# Patient Record
Sex: Female | Born: 1975 | Race: Black or African American | Hispanic: No | Marital: Single | Smoking: Never smoker
Health system: Southern US, Community
[De-identification: ages and names within clinical notes are randomized; demographics above are authoritative.]

## PROBLEM LIST (undated history)

## (undated) ENCOUNTER — Emergency Department (HOSPITAL_COMMUNITY): Admission: EM | Discharge status: Absent without leave | Payer: Self-pay

## (undated) DIAGNOSIS — O139 Gestational [pregnancy-induced] hypertension without significant proteinuria, unspecified trimester: Secondary | ICD-10-CM

## (undated) DIAGNOSIS — O1490 Unspecified pre-eclampsia, unspecified trimester: Secondary | ICD-10-CM

## (undated) DIAGNOSIS — Z789 Other specified health status: Secondary | ICD-10-CM

## (undated) HISTORY — DX: Unspecified pre-eclampsia, unspecified trimester: O14.90

## (undated) HISTORY — DX: Gestational (pregnancy-induced) hypertension without significant proteinuria, unspecified trimester: O13.9

## (undated) HISTORY — PX: ACHILLES TENDON REPAIR: SUR1153

---

## 2009-11-15 ENCOUNTER — Emergency Department (HOSPITAL_COMMUNITY): Admission: EM | Admit: 2009-11-15 | Discharge: 2009-11-15 | Payer: Self-pay | Admitting: Family Medicine

## 2009-12-07 ENCOUNTER — Emergency Department (HOSPITAL_COMMUNITY): Admission: EM | Admit: 2009-12-07 | Discharge: 2009-12-07 | Payer: Self-pay | Admitting: Emergency Medicine

## 2011-12-23 LAB — OB RESULTS CONSOLE HEPATITIS B SURFACE ANTIGEN: Hepatitis B Surface Ag: NEGATIVE

## 2011-12-23 LAB — OB RESULTS CONSOLE GC/CHLAMYDIA
Chlamydia: NEGATIVE
Gonorrhea: NEGATIVE

## 2011-12-23 LAB — OB RESULTS CONSOLE RUBELLA ANTIBODY, IGM: Rubella: IMMUNE

## 2011-12-23 LAB — OB RESULTS CONSOLE RPR: RPR: NONREACTIVE

## 2011-12-23 LAB — OB RESULTS CONSOLE HIV ANTIBODY (ROUTINE TESTING): HIV: NONREACTIVE

## 2012-07-08 ENCOUNTER — Encounter (HOSPITAL_COMMUNITY): Payer: Self-pay | Admitting: *Deleted

## 2012-07-08 ENCOUNTER — Other Ambulatory Visit: Payer: Self-pay | Admitting: Obstetrics and Gynecology

## 2012-07-08 ENCOUNTER — Encounter (HOSPITAL_COMMUNITY): Payer: Self-pay | Admitting: Pharmacist

## 2012-07-09 ENCOUNTER — Encounter (HOSPITAL_COMMUNITY)
Admission: RE | Disposition: A | Discharge status: Home / Self Care | Payer: Self-pay | Source: Ambulatory Visit | Attending: Obstetrics and Gynecology

## 2012-07-09 ENCOUNTER — Inpatient Hospital Stay (HOSPITAL_COMMUNITY)
Admission: RE | Admit: 2012-07-09 | Discharge: 2012-07-11 | DRG: 371 | Disposition: A | Discharge status: Home / Self Care | Payer: BC Managed Care – PPO | Source: Ambulatory Visit | Attending: Obstetrics and Gynecology | Admitting: Obstetrics and Gynecology

## 2012-07-09 ENCOUNTER — Encounter (HOSPITAL_COMMUNITY): Payer: Self-pay | Admitting: *Deleted

## 2012-07-09 ENCOUNTER — Encounter (HOSPITAL_COMMUNITY): Payer: Self-pay | Admitting: Anesthesiology

## 2012-07-09 ENCOUNTER — Inpatient Hospital Stay (HOSPITAL_COMMUNITY): Payer: BC Managed Care – PPO | Admitting: Anesthesiology

## 2012-07-09 DIAGNOSIS — O480 Post-term pregnancy: Principal | ICD-10-CM | POA: Diagnosis present

## 2012-07-09 DIAGNOSIS — O34219 Maternal care for unspecified type scar from previous cesarean delivery: Secondary | ICD-10-CM | POA: Diagnosis present

## 2012-07-09 DIAGNOSIS — O09529 Supervision of elderly multigravida, unspecified trimester: Secondary | ICD-10-CM | POA: Diagnosis present

## 2012-07-09 HISTORY — DX: Other specified health status: Z78.9

## 2012-07-09 LAB — COMPREHENSIVE METABOLIC PANEL
ALT: 12 U/L (ref 0–35)
Albumin: 2.5 g/dL — ABNORMAL LOW (ref 3.5–5.2)
Alkaline Phosphatase: 110 U/L (ref 39–117)
BUN: 7 mg/dL (ref 6–23)
Chloride: 100 mEq/L (ref 96–112)
GFR calc Af Amer: >90 mL/min (ref >90)
Glucose, Bld: 92 mg/dL (ref 70–99)
Potassium: 4.1 mEq/L (ref 3.5–5.1)
Sodium: 133 mEq/L — ABNORMAL LOW (ref 135–145)
Total Bilirubin: 0.5 mg/dL (ref 0.3–1.2)
Total Protein: 6.4 g/dL (ref 6.0–8.3)

## 2012-07-09 LAB — RPR: RPR Ser Ql: NONREACTIVE

## 2012-07-09 LAB — CBC
HCT: 31.3 % — ABNORMAL LOW (ref 36.0–46.0)
Hemoglobin: 10.7 g/dL — ABNORMAL LOW (ref 12.0–15.0)
Hemoglobin: 10 g/dL — ABNORMAL LOW (ref 12.0–15.0)
MCH: 29.2 pg (ref 26.0–34.0)
MCHC: 32.6 g/dL (ref 30.0–36.0)
WBC: 5.7 10*3/uL (ref 4.0–10.5)

## 2012-07-09 LAB — URIC ACID: Uric Acid, Serum: 4.1 mg/dL (ref 2.4–7.0)

## 2012-07-09 LAB — PREPARE RBC (CROSSMATCH)

## 2012-07-09 SURGERY — Surgical Case
Anesthesia: Spinal | Site: Abdomen

## 2012-07-09 MED ORDER — BUPIVACAINE IN DEXTROSE 0.75-8.25 % IT SOLN
INTRATHECAL | Status: DC | PRN
  Administered 2012-07-09: 1.6 mL via INTRATHECAL

## 2012-07-09 MED ORDER — PHENYLEPHRINE HCL 10 MG/ML IJ SOLN
INTRAMUSCULAR | Status: DC | PRN
  Administered 2012-07-09 (×2): 40 ug via INTRAVENOUS
  Administered 2012-07-09: 80 ug via INTRAVENOUS
  Administered 2012-07-09 (×3): 40 ug via INTRAVENOUS

## 2012-07-09 MED ORDER — OXYCODONE-ACETAMINOPHEN 5-325 MG PO TABS
1.0000 | ORAL_TABLET | ORAL | Status: DC | PRN
  Administered 2012-07-09 – 2012-07-11 (×9): 2 via ORAL
  Filled 2012-07-09 (×9): qty 2

## 2012-07-09 MED ORDER — LACTATED RINGERS IV SOLN
Freq: Once | INTRAVENOUS | Status: AC
  Administered 2012-07-09 (×5): via INTRAVENOUS

## 2012-07-09 MED ORDER — METOCLOPRAMIDE HCL 5 MG/ML IJ SOLN: 10.0000 mg | Freq: Three times a day (TID) | INTRAMUSCULAR | Status: DC | PRN

## 2012-07-09 MED ORDER — ONDANSETRON HCL 4 MG/2ML IJ SOLN
INTRAMUSCULAR | Status: AC
  Filled 2012-07-09: qty 2

## 2012-07-09 MED ORDER — MEPERIDINE HCL 25 MG/ML IJ SOLN: 6.2500 mg | INTRAMUSCULAR | Status: DC | PRN

## 2012-07-09 MED ORDER — DIPHENHYDRAMINE HCL 25 MG PO CAPS: 25.0000 mg | ORAL_CAPSULE | Freq: Four times a day (QID) | ORAL | Status: DC | PRN

## 2012-07-09 MED ORDER — MENTHOL 3 MG MT LOZG: 1.0000 | LOZENGE | OROMUCOSAL | Status: DC | PRN

## 2012-07-09 MED ORDER — ONDANSETRON HCL 4 MG/2ML IJ SOLN: 4.0000 mg | INTRAMUSCULAR | Status: DC | PRN

## 2012-07-09 MED ORDER — BISACODYL 10 MG RE SUPP: 10.0000 mg | Freq: Every day | RECTAL | Status: DC | PRN

## 2012-07-09 MED ORDER — WITCH HAZEL-GLYCERIN EX PADS: 1.0000 "application " | MEDICATED_PAD | CUTANEOUS | Status: DC | PRN

## 2012-07-09 MED ORDER — TETANUS-DIPHTH-ACELL PERTUSSIS 5-2.5-18.5 LF-MCG/0.5 IM SUSP
0.5000 mL | Freq: Once | INTRAMUSCULAR | Status: AC
  Administered 2012-07-11: 0.5 mL via INTRAMUSCULAR
  Filled 2012-07-09: qty 0.5

## 2012-07-09 MED ORDER — FENTANYL CITRATE 0.05 MG/ML IJ SOLN
INTRAMUSCULAR | Status: DC | PRN
  Administered 2012-07-09: 25 ug via INTRATHECAL

## 2012-07-09 MED ORDER — SENNOSIDES-DOCUSATE SODIUM 8.6-50 MG PO TABS
2.0000 | ORAL_TABLET | Freq: Every day | ORAL | Status: DC
  Administered 2012-07-09 – 2012-07-10 (×2): 2 via ORAL

## 2012-07-09 MED ORDER — NALOXONE HCL 0.4 MG/ML IJ SOLN: 0.4000 mg | INTRAMUSCULAR | Status: DC | PRN

## 2012-07-09 MED ORDER — DIPHENHYDRAMINE HCL 50 MG/ML IJ SOLN: 25.0000 mg | INTRAMUSCULAR | Status: DC | PRN

## 2012-07-09 MED ORDER — ONDANSETRON HCL 4 MG/2ML IJ SOLN
INTRAMUSCULAR | Status: DC | PRN
  Administered 2012-07-09: 4 mg via INTRAVENOUS

## 2012-07-09 MED ORDER — MEASLES, MUMPS & RUBELLA VAC ~~LOC~~ INJ: 0.5000 mL | INJECTION | Freq: Once | SUBCUTANEOUS | Status: DC

## 2012-07-09 MED ORDER — DIPHENHYDRAMINE HCL 50 MG/ML IJ SOLN: 12.5000 mg | INTRAMUSCULAR | Status: DC | PRN

## 2012-07-09 MED ORDER — LACTATED RINGERS IV SOLN
INTRAVENOUS | Status: DC | PRN
  Administered 2012-07-09 (×2): via INTRAVENOUS

## 2012-07-09 MED ORDER — CEFAZOLIN SODIUM-DEXTROSE 2-3 GM-% IV SOLR
INTRAVENOUS | Status: AC
  Filled 2012-07-09: qty 50

## 2012-07-09 MED ORDER — KETOROLAC TROMETHAMINE 60 MG/2ML IM SOLN
60.0000 mg | Freq: Once | INTRAMUSCULAR | Status: AC | PRN
  Administered 2012-07-09: 60 mg via INTRAMUSCULAR

## 2012-07-09 MED ORDER — PHENYLEPHRINE 40 MCG/ML (10ML) SYRINGE FOR IV PUSH (FOR BLOOD PRESSURE SUPPORT)
PREFILLED_SYRINGE | INTRAVENOUS | Status: AC
  Filled 2012-07-09: qty 5

## 2012-07-09 MED ORDER — DIPHENHYDRAMINE HCL 25 MG PO CAPS
25.0000 mg | ORAL_CAPSULE | ORAL | Status: DC | PRN
  Filled 2012-07-09: qty 1

## 2012-07-09 MED ORDER — EPHEDRINE SULFATE 50 MG/ML IJ SOLN
INTRAMUSCULAR | Status: DC | PRN
  Administered 2012-07-09: 10 mg via INTRAVENOUS
  Administered 2012-07-09: 5 mg via INTRAVENOUS

## 2012-07-09 MED ORDER — PROMETHAZINE HCL 25 MG/ML IJ SOLN: 6.2500 mg | INTRAMUSCULAR | Status: DC | PRN

## 2012-07-09 MED ORDER — SODIUM CHLORIDE 0.9 % IV SOLN
1.0000 ug/kg/h | INTRAVENOUS | Status: DC | PRN
  Filled 2012-07-09: qty 2.5

## 2012-07-09 MED ORDER — SODIUM CHLORIDE 0.9 % IJ SOLN: 3.0000 mL | INTRAMUSCULAR | Status: DC | PRN

## 2012-07-09 MED ORDER — CEFAZOLIN SODIUM-DEXTROSE 2-3 GM-% IV SOLR
2.0000 g | INTRAVENOUS | Status: AC
  Administered 2012-07-09: 2 g via INTRAVENOUS

## 2012-07-09 MED ORDER — OXYTOCIN 40 UNITS IN LACTATED RINGERS INFUSION - SIMPLE MED: 62.5000 mL/h | INTRAVENOUS | Status: AC

## 2012-07-09 MED ORDER — DIBUCAINE 1 % RE OINT: 1.0000 "application " | TOPICAL_OINTMENT | RECTAL | Status: DC | PRN

## 2012-07-09 MED ORDER — NALBUPHINE HCL 10 MG/ML IJ SOLN
5.0000 mg | INTRAMUSCULAR | Status: DC | PRN
  Filled 2012-07-09: qty 1

## 2012-07-09 MED ORDER — MORPHINE SULFATE 0.5 MG/ML IJ SOLN
INTRAMUSCULAR | Status: AC
  Filled 2012-07-09: qty 10

## 2012-07-09 MED ORDER — ONDANSETRON HCL 4 MG/2ML IJ SOLN: 4.0000 mg | Freq: Three times a day (TID) | INTRAMUSCULAR | Status: DC | PRN

## 2012-07-09 MED ORDER — OXYTOCIN 10 UNIT/ML IJ SOLN
INTRAMUSCULAR | Status: DC | PRN
  Administered 2012-07-09: 40 [IU]

## 2012-07-09 MED ORDER — SIMETHICONE 80 MG PO CHEW: 80.0000 mg | CHEWABLE_TABLET | ORAL | Status: DC | PRN

## 2012-07-09 MED ORDER — FLEET ENEMA 7-19 GM/118ML RE ENEM: 1.0000 | ENEMA | Freq: Every day | RECTAL | Status: DC | PRN

## 2012-07-09 MED ORDER — KETOROLAC TROMETHAMINE 30 MG/ML IJ SOLN: 15.0000 mg | Freq: Once | INTRAMUSCULAR | Status: DC | PRN

## 2012-07-09 MED ORDER — MORPHINE SULFATE (PF) 0.5 MG/ML IJ SOLN
INTRAMUSCULAR | Status: DC | PRN
  Administered 2012-07-09: .1 mg via INTRATHECAL

## 2012-07-09 MED ORDER — METHYLERGONOVINE MALEATE 0.2 MG/ML IJ SOLN: 0.2000 mg | INTRAMUSCULAR | Status: DC | PRN

## 2012-07-09 MED ORDER — METHYLERGONOVINE MALEATE 0.2 MG PO TABS: 0.2000 mg | ORAL_TABLET | ORAL | Status: DC | PRN

## 2012-07-09 MED ORDER — SCOPOLAMINE 1 MG/3DAYS TD PT72
MEDICATED_PATCH | TRANSDERMAL | Status: AC
  Administered 2012-07-09: 1.5 mg via TRANSDERMAL
  Filled 2012-07-09: qty 1

## 2012-07-09 MED ORDER — LACTATED RINGERS IV SOLN
INTRAVENOUS | Status: DC
Start: 2012-07-09 — End: 2012-07-11

## 2012-07-09 MED ORDER — IBUPROFEN 600 MG PO TABS
600.0000 mg | ORAL_TABLET | Freq: Four times a day (QID) | ORAL | Status: DC
  Administered 2012-07-09 – 2012-07-11 (×7): 600 mg via ORAL
  Filled 2012-07-09 (×6): qty 1

## 2012-07-09 MED ORDER — SCOPOLAMINE 1 MG/3DAYS TD PT72
1.0000 | MEDICATED_PATCH | Freq: Once | TRANSDERMAL | Status: DC
  Administered 2012-07-09: 1.5 mg via TRANSDERMAL

## 2012-07-09 MED ORDER — FENTANYL CITRATE 0.05 MG/ML IJ SOLN
INTRAMUSCULAR | Status: AC
  Filled 2012-07-09: qty 2

## 2012-07-09 MED ORDER — PRENATAL MULTIVITAMIN CH
1.0000 | ORAL_TABLET | Freq: Every day | ORAL | Status: DC
  Administered 2012-07-10 – 2012-07-11 (×2): 1 via ORAL
  Filled 2012-07-09 (×2): qty 1

## 2012-07-09 MED ORDER — EPHEDRINE 5 MG/ML INJ
INTRAVENOUS | Status: AC
  Filled 2012-07-09: qty 10

## 2012-07-09 MED ORDER — OXYTOCIN 10 UNIT/ML IJ SOLN
INTRAMUSCULAR | Status: AC
  Filled 2012-07-09: qty 1

## 2012-07-09 MED ORDER — LANOLIN HYDROUS EX OINT: 1.0000 "application " | TOPICAL_OINTMENT | CUTANEOUS | Status: DC | PRN

## 2012-07-09 MED ORDER — FERROUS SULFATE 325 (65 FE) MG PO TABS
325.0000 mg | ORAL_TABLET | Freq: Two times a day (BID) | ORAL | Status: DC
  Administered 2012-07-09 – 2012-07-11 (×4): 325 mg via ORAL
  Filled 2012-07-09 (×5): qty 1

## 2012-07-09 MED ORDER — HYDROMORPHONE HCL PF 1 MG/ML IJ SOLN: 0.2500 mg | INTRAMUSCULAR | Status: DC | PRN

## 2012-07-09 MED ORDER — ZOLPIDEM TARTRATE 5 MG PO TABS: 5.0000 mg | ORAL_TABLET | Freq: Every evening | ORAL | Status: DC | PRN

## 2012-07-09 MED ORDER — FENTANYL CITRATE 0.05 MG/ML IJ SOLN
INTRAMUSCULAR | Status: DC | PRN
  Administered 2012-07-09: 50 ug via INTRAVENOUS
  Administered 2012-07-09: 25 ug via INTRAVENOUS

## 2012-07-09 MED ORDER — KETOROLAC TROMETHAMINE 30 MG/ML IJ SOLN: 30.0000 mg | Freq: Four times a day (QID) | INTRAMUSCULAR | Status: AC | PRN

## 2012-07-09 MED ORDER — KETOROLAC TROMETHAMINE 60 MG/2ML IM SOLN
INTRAMUSCULAR | Status: AC
  Administered 2012-07-09: 60 mg via INTRAMUSCULAR
  Filled 2012-07-09: qty 2

## 2012-07-09 MED ORDER — ONDANSETRON HCL 4 MG PO TABS: 4.0000 mg | ORAL_TABLET | ORAL | Status: DC | PRN

## 2012-07-09 MED ORDER — SIMETHICONE 80 MG PO CHEW
80.0000 mg | CHEWABLE_TABLET | Freq: Three times a day (TID) | ORAL | Status: DC
  Administered 2012-07-09 – 2012-07-11 (×8): 80 mg via ORAL

## 2012-07-09 SURGICAL SUPPLY — 34 items
ADH SKN CLS APL DERMABOND .7 (GAUZE/BANDAGES/DRESSINGS) ×2
CLOTH BEACON ORANGE TIMEOUT ST (SAFETY) ×2 IMPLANT
DERMABOND ADVANCED (GAUZE/BANDAGES/DRESSINGS) ×2
DERMABOND ADVANCED .7 DNX12 (GAUZE/BANDAGES/DRESSINGS) IMPLANT
DRAPE SURG 17X23 STRL (DRAPES) ×1 IMPLANT
DRSG COVADERM 4X10 (GAUZE/BANDAGES/DRESSINGS) ×1 IMPLANT
DURAPREP 26ML APPLICATOR (WOUND CARE) ×2 IMPLANT
ELECT REM PT RETURN 9FT ADLT (ELECTROSURGICAL) ×2
ELECTRODE REM PT RTRN 9FT ADLT (ELECTROSURGICAL) ×1 IMPLANT
EXTRACTOR VACUUM BELL STYLE (SUCTIONS) IMPLANT
GLOVE BIO SURGEON STRL SZ7 (GLOVE) ×4 IMPLANT
GOWN PREVENTION PLUS LG XLONG (DISPOSABLE) ×4 IMPLANT
KIT ABG SYR 3ML LUER SLIP (SYRINGE) IMPLANT
NDL HYPO 25X5/8 SAFETYGLIDE (NEEDLE) IMPLANT
NEEDLE HYPO 25X5/8 SAFETYGLIDE (NEEDLE) IMPLANT
NS IRRIG 1000ML POUR BTL (IV SOLUTION) ×2 IMPLANT
PACK C SECTION WH (CUSTOM PROCEDURE TRAY) ×2 IMPLANT
PAD OB MATERNITY 4.3X12.25 (PERSONAL CARE ITEMS) IMPLANT
RETRACTOR WND ALEXIS 25 LRG (MISCELLANEOUS) ×1 IMPLANT
RTRCTR WOUND ALEXIS 25CM LRG (MISCELLANEOUS) ×2
SLEEVE SCD COMPRESS KNEE MED (MISCELLANEOUS) IMPLANT
STAPLER VISISTAT 35W (STAPLE) IMPLANT
SUT MNCRL 0 VIOLET CTX 36 (SUTURE) ×2 IMPLANT
SUT MONOCRYL 0 CTX 36 (SUTURE) ×2
SUT PDS AB 0 CTX 60 (SUTURE) IMPLANT
SUT PLAIN 2 0 XLH (SUTURE) ×1 IMPLANT
SUT VIC AB 0 CT1 27 (SUTURE) ×6
SUT VIC AB 0 CT1 27XBRD ANBCTR (SUTURE) ×2 IMPLANT
SUT VIC AB 2-0 CT1 27 (SUTURE) ×2
SUT VIC AB 2-0 CT1 TAPERPNT 27 (SUTURE) ×1 IMPLANT
SUT VIC AB 4-0 KS 27 (SUTURE) ×1 IMPLANT
TOWEL OR 17X24 6PK STRL BLUE (TOWEL DISPOSABLE) ×4 IMPLANT
TRAY FOLEY CATH 14FR (SET/KITS/TRAYS/PACK) ×2 IMPLANT
WATER STERILE IRR 1000ML POUR (IV SOLUTION) ×1 IMPLANT

## 2012-07-09 NOTE — Transfer of Care (Signed)
Immediate Anesthesia Transfer of Care Note  Patient: Suzanne Simpson  Procedure(s) Performed: Procedure(s) (LRB) with comments: CESAREAN SECTION (N/A)  Patient Location: PACU  Anesthesia Type:Spinal  Level of Consciousness: awake, alert , oriented and patient cooperative  Airway & Oxygen Therapy: Patient Spontanous Breathing  Post-op Assessment: Report given to PACU RN and Post -op Vital signs reviewed and stable  Post vital signs: Reviewed and stable  Complications: No apparent anesthesia complications

## 2012-07-09 NOTE — Preoperative (Signed)
Beta Blockers   Reason not to administer Beta Blockers:Not Applicable 

## 2012-07-09 NOTE — Anesthesia Preprocedure Evaluation (Signed)
Anesthesia Evaluation  Patient identified by MRN, date of birth, ID band Patient awake    Reviewed: Allergy & Precautions, H&P , NPO status , Patient's Chart, lab work & pertinent test results  Airway Mallampati: II TM Distance: >3 FB Neck ROM: full    Dental  (+) Poor Dentition   Pulmonary neg pulmonary ROS,    Pulmonary exam normal       Cardiovascular negative cardio ROS      Neuro/Psych negative neurological ROS  negative psych ROS   GI/Hepatic negative GI ROS, Neg liver ROS,   Endo/Other  negative endocrine ROSMorbid obesity  Renal/GU negative Renal ROS  negative genitourinary   Musculoskeletal negative musculoskeletal ROS (+)   Abdominal (+) + obese,   Peds negative pediatric ROS (+)  Hematology negative hematology ROS (+)   Anesthesia Other Findings   Reproductive/Obstetrics (+) Pregnancy                           Anesthesia Physical Anesthesia Plan  ASA: III  Anesthesia Plan: Spinal   Post-op Pain Management:    Induction:   Airway Management Planned:   Additional Equipment:   Intra-op Plan:   Post-operative Plan:   Informed Consent: I have reviewed the patients History and Physical, chart, labs and discussed the procedure including the risks, benefits and alternatives for the proposed anesthesia with the patient or authorized representative who has indicated his/her understanding and acceptance.     Plan Discussed with: CRNA and Surgeon  Anesthesia Plan Comments:         Anesthesia Quick Evaluation

## 2012-07-09 NOTE — Brief Op Note (Signed)
07/09/2012  8:32 AM  PATIENT:  Suzanne Simpson  36 y.o. female  PRE-OPERATIVE DIAGNOSIS:  REPEAT c/s at term POST-OPERATIVE DIAGNOSIS:  same  PROCEDURE:  Procedure(s) (LRB) with comments: CESAREAN SECTION (N/A)  SURGEON:  Surgeon(s) and Role:    * Keric Zehren A Terecia Plaut, MD - Primary    ASSISTANTS: Dr. Bowie   ANESTHESIA:   spinal  EBL:  Total I/O In: 3000 [I.V.:3000] Out: 800 [Urine:200; Blood:600]   SPECIMEN:  No Specimen  DISPOSITION OF SPECIMEN:  N/A  COUNTS:  YES  DICTATION: .Note written in EPIC  PLAN OF CARE: Admit to inpatient   PATIENT DISPOSITION:  PACU - hemodynamically stable.   Delay start of Pharmacological VTE agent (>24hrs) due to surgical blood loss or risk of bleeding: not applicable  Complications:  none Medications:  Ancef, Pitocin Findings:  Baby female, Apgars 9,9, weight P.   Normal tubes, ovaries and uterus seen.  Technique:  After adequate spina anesthesia was achieved, the patient was prepped and draped in usual sterile fashion.  A foley catheter was used to drain the bladder.  A pfannanstiel incision was made with the scalpel and carried down to the fascia with the bovie cautery. The fascia was incised in the midline with the scalpel and carried in a transverse curvilinear manner bilaterally.  The fascia was reflected superiorly and inferiorly off the rectus muscles and the muscles split in the midline.  A bowel free portion of the peritoneum was entered bluntly and then extended in a superior and inferior manner with good visualization of the bowel and bladder.  The Alexis instrument was then placed and the vesico-uterine fascia tented up and incised in a transverse curvilinear manner.  A 2 cm transverse incision was made in the upper portion of the lower uterine segment until the amnion was exposed.   The incision was extended transversely in a blunt manner.  Clear fluid was noted and the baby delivered in the vertex presentation without complication.   The baby was bulb suctioned and the cord was clamped and cut.  The baby was then handed to awaiting Neonatology.  The placenta was then delivered manually and the uterus cleared of all debris.  The uterine incision was then closed with a running lock stitch of 0 monocryl.  An imbricating layer of 0 monocryl was closed as well. Good hemostasis of the uterine incision was achieved and the abdomen was cleared with irrigation.  The peritoneum was closed with a running stitch of 2-0 vicryl.  This incorporated the rectus muscles as a separate layer.  The fascia was then closed with a running stitch of 0 vicryl.  The subcutaneous layer was closed with interrupted  stitches of 2-0 plain gut.  The skin was closed with 3-0 vicryl in SQ with a keith needle and then covered over with dermabond.  The patient tolerated the procedure well and was returned to the recovery room in stable condition.  All counts were correct times three.  Rockie Vawter A     

## 2012-07-09 NOTE — Progress Notes (Signed)
Pt did sign blood consent form. She stated "i will take blood if life or death".

## 2012-07-09 NOTE — Op Note (Signed)
07/09/2012  8:32 AM  PATIENT:  Suzanne Simpson  36 y.o. female  PRE-OPERATIVE DIAGNOSIS:  REPEAT c/s at term POST-OPERATIVE DIAGNOSIS:  same  PROCEDURE:  Procedure(s) (LRB) with comments: CESAREAN SECTION (N/A)  SURGEON:  Surgeon(s) and Role:    * Loney Laurence, MD - Primary    ASSISTANTS: Dr. Greta Doom   ANESTHESIA:   spinal  EBL:  Total I/O In: 3000 [I.V.:3000] Out: 800 [Urine:200; Blood:600]   SPECIMEN:  No Specimen  DISPOSITION OF SPECIMEN:  N/A  COUNTS:  YES  DICTATION: .Note written in EPIC  PLAN OF CARE: Admit to inpatient   PATIENT DISPOSITION:  PACU - hemodynamically stable.   Delay start of Pharmacological VTE agent (>24hrs) due to surgical blood loss or risk of bleeding: not applicable  Complications:  none Medications:  Ancef, Pitocin Findings:  Baby female, Apgars 9,9, weight P.   Normal tubes, ovaries and uterus seen.  Technique:  After adequate spina anesthesia was achieved, the patient was prepped and draped in usual sterile fashion.  A foley catheter was used to drain the bladder.  A pfannanstiel incision was made with the scalpel and carried down to the fascia with the bovie cautery. The fascia was incised in the midline with the scalpel and carried in a transverse curvilinear manner bilaterally.  The fascia was reflected superiorly and inferiorly off the rectus muscles and the muscles split in the midline.  A bowel free portion of the peritoneum was entered bluntly and then extended in a superior and inferior manner with good visualization of the bowel and bladder.  The Alexis instrument was then placed and the vesico-uterine fascia tented up and incised in a transverse curvilinear manner.  A 2 cm transverse incision was made in the upper portion of the lower uterine segment until the amnion was exposed.   The incision was extended transversely in a blunt manner.  Clear fluid was noted and the baby delivered in the vertex presentation without complication.   The baby was bulb suctioned and the cord was clamped and cut.  The baby was then handed to awaiting Neonatology.  The placenta was then delivered manually and the uterus cleared of all debris.  The uterine incision was then closed with a running lock stitch of 0 monocryl.  An imbricating layer of 0 monocryl was closed as well. Good hemostasis of the uterine incision was achieved and the abdomen was cleared with irrigation.  The peritoneum was closed with a running stitch of 2-0 vicryl.  This incorporated the rectus muscles as a separate layer.  The fascia was then closed with a running stitch of 0 vicryl.  The subcutaneous layer was closed with interrupted  stitches of 2-0 plain gut.  The skin was closed with 3-0 vicryl in SQ with a keith needle and then covered over with dermabond.  The patient tolerated the procedure well and was returned to the recovery room in stable condition.  All counts were correct times three.  [name]

## 2012-07-09 NOTE — OR Nursing (Signed)
Pt had some elevated blood pressures in pacu  As high as 160/90 pt assymptomatic. Called to dr Henderson Cloud orders received to draw pih labs. Labs drawn in pacu by Florentina Addison.

## 2012-07-09 NOTE — Anesthesia Procedure Notes (Signed)
Spinal  Patient location during procedure: OR Start time: 07/09/2012 7:36 AM Staffing Anesthesiologist: Brayton Caves R Performed by: anesthesiologist  Preanesthetic Checklist Completed: patient identified, site marked, surgical consent, pre-op evaluation, timeout performed, IV checked, risks and benefits discussed and monitors and equipment checked Spinal Block Patient position: sitting Prep: DuraPrep Patient monitoring: heart rate, cardiac monitor, continuous pulse ox and blood pressure Approach: midline Location: L3-4 Injection technique: single-shot Needle Needle type: Sprotte  Needle gauge: 24 G Needle length: 9 cm Assessment Sensory level: T4 Additional Notes Patient identified.  Risk benefits discussed including failed block, incomplete pain control, headache, nerve damage, paralysis, blood pressure changes, nausea, vomiting, reactions to medication both toxic or allergic, and postpartum back pain.  Patient expressed understanding and wished to proceed.  All questions were answered.  Sterile technique used throughout procedure.  CSF was clear.  No parasthesia or other complications.  Please see nursing notes for vital signs.

## 2012-07-09 NOTE — H&P (Signed)
36 y.o.  [redacted]w[redacted]d    G2P1 comes in for a repeat cesarean section at term.  Patient has good fetal movement and no bleeding.  Pt is listed as a Jehovah's Witness but has signed the consent for blood and blood products in the case that it is needed to save her life.  Past Medical History  Diagnosis Date  . No pertinent past medical history     Past Surgical History  Procedure Date  . Cesarean section 2003    OB History    Grav Para Term Preterm Abortions TAB SAB Ect Mult Living   2         1     # Outc Date GA Lbr Len/2nd Wgt Sex Del Anes PTL Lv   1 GRA            2 CUR               History   Social History  . Marital Status: Single    Spouse Name: N/A    Number of Children: N/A  . Years of Education: N/A   Occupational History  . Not on file.   Social History Main Topics  . Smoking status: Never Smoker   . Smokeless tobacco: Not on file  . Alcohol Use: No  . Drug Use: No  . Sexually Active:    Other Topics Concern  . Not on file   Social History Narrative  . No narrative on file   Review of patient's allergies indicates no known allergies.   Prenatal Course: Uncomplicated.  Filed Vitals:   07/09/12 0635  BP: 187/107  Pulse: 82  Temp: 98.4 F (36.9 C)  Resp: 18     Lungs/Cor:  NAD Abdomen:  soft, gravid Ex:  no cords, erythema SVE:  NA FHTs:  Present.  A/P   For repeat cesarean sectionat term.  All risks, benefits and alternatives discussed with patient and she desires to proceed. Pt has been counseled and accepts blood products in the event that it is needed to save her life.   Onur Mori A

## 2012-07-09 NOTE — Anesthesia Postprocedure Evaluation (Signed)
Anesthesia Post Note  Patient: Suzanne Simpson  Procedure(s) Performed: Procedure(s) (LRB): CESAREAN SECTION (N/A)  Anesthesia type: Spinal  Patient location: PACU  Post pain: Pain level controlled  Post assessment: Post-op Vital signs reviewed  Last Vitals:  Filed Vitals:   07/09/12 0635  BP: 187/107  Pulse: 82  Temp: 36.9 C  Resp: 18    Post vital signs: Reviewed  Level of consciousness: awake  Complications: No apparent anesthesia complications

## 2012-07-09 NOTE — Consult Note (Signed)
Neonatology Note:  Attendance at C-section:  I was asked to attend this repeat C/S at term. The mother is a G2P1 A pos, GBS pos who is a Jehovah's Witness. ROM at delivery, fluid clear. Infant vigorous with good spontaneous cry and tone. Needed only minimal bulb suctioning. Ap 9/9. Lungs clear to ausc in DR. To CN to care of Pediatrician.  Viola Kinnick, MD  

## 2012-07-09 NOTE — Anesthesia Postprocedure Evaluation (Signed)
Anesthesia Post Note  Patient: Suzanne Simpson  Procedure(s) Performed: Procedure(s) (LRB): CESAREAN SECTION (N/A)  Anesthesia type: Spinal  Patient location: Mother/Baby  Post pain: Pain level controlled  Post assessment: Post-op Vital signs reviewed  Last Vitals:  Filed Vitals:   07/09/12 1830  BP: 143/88  Pulse: 78  Temp: 36.6 C  Resp: 20    Post vital signs: Reviewed  Level of consciousness: awake  Complications: No apparent anesthesia complications

## 2012-07-09 NOTE — Addendum Note (Signed)
Addendum  created 07/09/12 1906 by Algis Greenhouse, CRNA   Modules edited:Notes Section

## 2012-07-10 ENCOUNTER — Encounter (HOSPITAL_COMMUNITY): Payer: Self-pay | Admitting: Obstetrics and Gynecology

## 2012-07-10 LAB — CBC
MCH: 28.1 pg (ref 26.0–34.0)
MCHC: 31.3 g/dL (ref 30.0–36.0)
MCV: 89.6 fL (ref 78.0–100.0)
Platelets: 194 10*3/uL (ref 150–400)
RDW: 14 % (ref 11.5–15.5)

## 2012-07-10 NOTE — Progress Notes (Signed)
Subjective: Postpartum Day 1: Cesarean Delivery Patient reports incisional pain and tolerating PO.    Objective: Vital signs in last 24 hours: Temp:  [97.6 F (36.4 C)-98.3 F (36.8 C)] 98.3 F (36.8 C) (11/01 0553) Pulse Rate:  [69-92] 80  (11/01 0553) Resp:  [18-20] 18  (11/01 0553) BP: (117-162)/(60-96) 139/85 mmHg (11/01 0553) SpO2:  [98 %-100 %] 98 % (11/01 0106)  Physical Exam:  General: alert, cooperative and appears stated age Lochia: appropriate Uterine Fundus: firm Incision: dressing intact.  Inferior edge bloody, pt states from peri-pad not incision DVT Evaluation: No evidence of DVT seen on physical exam.   Basename 07/10/12 0520 07/09/12 1012  HGB 8.9* 10.0*  HCT 28.4* 31.3*    Assessment/Plan: Status post Cesarean section. Doing well postoperatively.  Continue current care. Desires neonatal circ. R/B/A reviewed, pt wishes to proceed  Meranda Dechaine H. 07/10/2012, 10:53 AM

## 2012-07-11 MED ORDER — IBUPROFEN 600 MG PO TABS
600.0000 mg | ORAL_TABLET | Freq: Four times a day (QID) | ORAL | Status: DC | PRN
Start: 2012-07-11 — End: 2013-02-15

## 2012-07-11 MED ORDER — OXYCODONE-ACETAMINOPHEN 5-325 MG PO TABS: 2.0000 | ORAL_TABLET | ORAL | Status: DC | PRN

## 2012-07-11 MED ORDER — DOCUSATE SODIUM 100 MG PO CAPS: 100.0000 mg | ORAL_CAPSULE | Freq: Two times a day (BID) | ORAL | Status: DC

## 2012-07-11 NOTE — Progress Notes (Signed)
Subjective: Postpartum Day 2: Cesarean Delivery Patient reports doing well. Pain controlled.  Baby slept poorly.  Pt bottle feeding.  Objective: Vital signs in last 24 hours: Temp:  [97.6 F (36.4 C)-98.3 F (36.8 C)] 97.6 F (36.4 C) (11/02 0434) Pulse Rate:  [79-90] 79  (11/02 0434) Resp:  [18-20] 20  (11/02 0434) BP: (136-154)/(90-98) 154/98 mmHg (11/02 0434)  Physical Exam:  General: alert, cooperative and appears stated age Lochia: appropriate Uterine Fundus: firm Incision: healing well DVT Evaluation: No evidence of DVT seen on physical exam.   Basename 07/10/12 0520 07/09/12 1012  HGB 8.9* 10.0*  HCT 28.4* 31.3*    Assessment/Plan: Status post Cesarean section. Doing well postoperatively.  Continue current care. Considering early discharge.  Will D/C home. If changes mind can cancel  Suzanne Simpson H. 07/11/2012, 8:00 AM

## 2012-07-12 LAB — TYPE AND SCREEN
Antibody Screen: NEGATIVE
Unit division: 0

## 2012-07-13 NOTE — Discharge Summary (Signed)
Obstetric Discharge Summary Reason for Admission: cesarean section Prenatal Procedures: ultrasound Intrapartum Procedures: cesarean: low cervical, transverse Postpartum Procedures: none Complications-Operative and Postpartum: none Hemoglobin  Date Value Range Status  07/10/2012 8.9* 12.0 - 15.0 g/dL Final     HCT  Date Value Range Status  07/10/2012 28.4* 36.0 - 46.0 % Final    Physical Exam:  General: alert, cooperative and appears stated age 36: appropriate Uterine Fundus: firm Incision: healing well DVT Evaluation: No evidence of DVT seen on physical exam.  Discharge Diagnoses: Term Pregnancy-delivered and Post-date pregnancy  Discharge Information: Date: 07/13/2012 Activity: pelvic rest Diet: routine Medications: Ibuprofen, Colace and Percocet Condition: improved Instructions: refer to practice specific booklet Discharge to: home Follow-up Information    Follow up with HORVATH,MICHELLE A, MD. In 4 weeks. (Call office and schedule a follow up appointment for in 4 weeks. )    Contact information:   [address] [place] [address] [phone]          Newborn Data: Live born female  Birth Weight: 8 lb 4.8 oz (3765 g) APGAR: 9, 9  Home with mother.  Whalen Trompeter H. 07/13/2012, 2:33 AM

## 2012-07-15 ENCOUNTER — Observation Stay (HOSPITAL_COMMUNITY)
Admission: AD | Admit: 2012-07-15 | Discharge: 2012-07-17 | Disposition: A | Discharge status: Home / Self Care | Payer: BC Managed Care – PPO | Source: Ambulatory Visit | Attending: Obstetrics and Gynecology | Admitting: Obstetrics and Gynecology

## 2012-07-15 ENCOUNTER — Encounter (HOSPITAL_COMMUNITY): Payer: Self-pay

## 2012-07-15 DIAGNOSIS — O135 Gestational [pregnancy-induced] hypertension without significant proteinuria, complicating the puerperium: Principal | ICD-10-CM | POA: Insufficient documentation

## 2012-07-15 DIAGNOSIS — O139 Gestational [pregnancy-induced] hypertension without significant proteinuria, unspecified trimester: Secondary | ICD-10-CM

## 2012-07-15 LAB — CBC
HCT: 30.4 % — ABNORMAL LOW (ref 36.0–46.0)
Hemoglobin: 9.7 g/dL — ABNORMAL LOW (ref 12.0–15.0)
MCH: 28.5 pg (ref 26.0–34.0)
MCHC: 31.9 g/dL (ref 30.0–36.0)

## 2012-07-15 LAB — COMPREHENSIVE METABOLIC PANEL
Alkaline Phosphatase: 99 U/L (ref 39–117)
BUN: 12 mg/dL (ref 6–23)
Calcium: 9.5 mg/dL (ref 8.4–10.5)
GFR calc Af Amer: 84 mL/min — ABNORMAL LOW (ref >90)
Glucose, Bld: 99 mg/dL (ref 70–99)
Potassium: 4.3 mEq/L (ref 3.5–5.1)
Total Protein: 7.4 g/dL (ref 6.0–8.3)

## 2012-07-15 MED ORDER — ONDANSETRON HCL 4 MG/2ML IJ SOLN: 4.0000 mg | INTRAMUSCULAR | Status: DC | PRN

## 2012-07-15 MED ORDER — OXYCODONE-ACETAMINOPHEN 5-325 MG PO TABS
2.0000 | ORAL_TABLET | Freq: Once | ORAL | Status: AC
  Administered 2012-07-15: 2 via ORAL
  Filled 2012-07-15: qty 2

## 2012-07-15 MED ORDER — DIPHENHYDRAMINE HCL 25 MG PO CAPS: 25.0000 mg | ORAL_CAPSULE | Freq: Four times a day (QID) | ORAL | Status: DC | PRN

## 2012-07-15 MED ORDER — SIMETHICONE 80 MG PO CHEW
80.0000 mg | CHEWABLE_TABLET | ORAL | Status: DC | PRN
  Filled 2012-07-15: qty 1

## 2012-07-15 MED ORDER — OXYCODONE-ACETAMINOPHEN 5-325 MG PO TABS
1.0000 | ORAL_TABLET | ORAL | Status: DC | PRN
  Administered 2012-07-16 – 2012-07-17 (×3): 2 via ORAL
  Filled 2012-07-15 (×3): qty 2

## 2012-07-15 MED ORDER — SENNOSIDES-DOCUSATE SODIUM 8.6-50 MG PO TABS
2.0000 | ORAL_TABLET | Freq: Every day | ORAL | Status: DC
  Administered 2012-07-15 – 2012-07-16 (×2): 2 via ORAL
  Filled 2012-07-15: qty 2

## 2012-07-15 MED ORDER — ZOLPIDEM TARTRATE 5 MG PO TABS
5.0000 mg | ORAL_TABLET | Freq: Every evening | ORAL | Status: DC | PRN
  Administered 2012-07-15: 5 mg via ORAL
  Filled 2012-07-15: qty 1

## 2012-07-15 MED ORDER — PRENATAL MULTIVITAMIN CH
1.0000 | ORAL_TABLET | Freq: Every day | ORAL | Status: DC
  Administered 2012-07-16 – 2012-07-17 (×2): 1 via ORAL
  Filled 2012-07-15 (×2): qty 1

## 2012-07-15 MED ORDER — IBUPROFEN 600 MG PO TABS
600.0000 mg | ORAL_TABLET | Freq: Four times a day (QID) | ORAL | Status: DC
  Administered 2012-07-15 – 2012-07-17 (×6): 600 mg via ORAL
  Filled 2012-07-15 (×6): qty 1

## 2012-07-15 MED ORDER — ONDANSETRON HCL 4 MG PO TABS: 4.0000 mg | ORAL_TABLET | ORAL | Status: DC | PRN

## 2012-07-15 NOTE — MAU Note (Signed)
Pt reports c/s 10/31, had elevated b/p with delivery and home nurse today reported elevated pressure and told her she needed to come here

## 2012-07-15 NOTE — Progress Notes (Signed)
Pt presently upset because she is being admitted for obs. And pt wants to be with her baby. Emotional support provided.

## 2012-07-15 NOTE — Progress Notes (Signed)
Pt remains upset about admission. Pt is teary and anxious.

## 2012-07-15 NOTE — MAU Note (Signed)
Pt presents tonight with a headache that started yesterday. Home health nurse that visited earlier today told pt to come in and be seen.

## 2012-07-15 NOTE — MAU Provider Note (Signed)
CC: Hypertension    First Provider Initiated Contact with Patient 07/15/12 2106      HPI Suzanne Simpson ia a 36 y.o. G2P2002 6 days postop ERCS who was advised to come for evaluation of elevated BP noted to be 150's/90's by [facility] today. States BP elevated at last PNV. Documented 187/107 on admission for C/S 07/09/12 and range 135-159/90-102 during hospital stay. Gives 2 day history of headache behind rt eye despite taking Percocet and ibuprofen; Percocet 2 tabs last taken @1800 , total of 6 tabs today.  No visual disturbance or RUQ pain. LFTs normal and  .65 on 07/09/12.  Past Medical History  Diagnosis Date  . No pertinent past medical history     OB History    Grav Para Term Preterm Abortions TAB SAB Ect Mult Living   2 1 1       2      # Outc Date GA Lbr Len/2nd Wgt Sex Del Anes PTL Lv   1 TRM 10/13 [redacted]w[redacted]d 00:00 8lb4.8oz(3.765kg) M LTCS Spinal  Yes   2 GRA               Past Surgical History  Procedure Date  . Cesarean section 2003  . Cesarean section 07/09/2012    Procedure: CESAREAN SECTION;  Surgeon: Loney Laurence, MD;  Location: [facility];  Service: Obstetrics;  Laterality: N/A;    History   Social History  . Marital Status: Single    Spouse Name: N/A    Number of Children: N/A  . Years of Education: N/A   Occupational History  . Not on file.   Social History Main Topics  . Smoking status: Never Smoker   . Smokeless tobacco: Not on file  . Alcohol Use: No  . Drug Use: No  . Sexually Active:    Other Topics Concern  . Not on file   Social History Narrative  . No narrative on file    No current facility-administered medications on file prior to encounter.   Current Outpatient Prescriptions on File Prior to Encounter  Medication Sig Dispense Refill  . docusate sodium (COLACE) 100 MG capsule Take 1 capsule (100 mg total) by mouth 2 (two) times daily.  60 capsule  0  . ferrous sulfate 325 (65 FE) MG EC tablet Take 325 mg by mouth 3 (three) times daily with  meals.      . flintstones complete (FLINTSTONES) 60 MG chewable tablet Chew 1 tablet by mouth daily.      Marland Kitchen ibuprofen (ADVIL,MOTRIN) 600 MG tablet Take 1 tablet (600 mg total) by mouth every 6 (six) hours as needed for pain.  90 tablet  0  . oxyCODONE-acetaminophen (ROXICET) 5-325 MG per tablet Take 2 tablets by mouth every 4 (four) hours as needed for pain. May take 1-2 tablets every 4-6 hours as needed for pain  46 tablet  0    No Known Allergies  ROS Pertinent items in HPI  PHYSICAL EXAM Filed Vitals:   07/15/12 2055 07/15/12 2102 07/15/12 2117 07/15/12 2133  BP: 145/96 143/90 169/104 173/108  Pulse: 70 72 69 68  Temp:      TempSrc:      Resp: 18 20 18    Height:      Weight:      SpO2:       General: Well nourished, well developed female in no acute distress Cardiovascular: RRR w/o m Respiratory: Normal effort; CTA bilat Abdomen: Soft, minimally tender peri-incisional, well involuted, incision not inflamed  without discharge, left skin edge overriding Back: No CVAT Extremities: Tr edema; DTRs 1+, no clonus Neurologic: Alert and oriented  LAB RESULTS Results for orders placed during the hospital encounter of 07/15/12 (from the past 24 hour(s))  CBC     Status: Abnormal   Collection Time   07/15/12  8:12 PM      Component Value Range   WBC 6.1  4.0 - 10.5 K/uL   RBC 3.40 (*) 3.87 - 5.11 MIL/uL   Hemoglobin 9.7 (*) 12.0 - 15.0 g/dL   HCT 16.1 (*) 09.6 - 04.5 %   MCV 89.4  78.0 - 100.0 fL   MCH 28.5  26.0 - 34.0 pg   MCHC 31.9  30.0 - 36.0 g/dL   RDW 40.9  81.1 - 91.4 %   Platelets 278  150 - 400 K/uL  COMPREHENSIVE METABOLIC PANEL     Status: Abnormal   Collection Time   07/15/12  8:12 PM      Component Value Range   Sodium 136  135 - 145 mEq/L   Potassium 4.3  3.5 - 5.1 mEq/L   Chloride 100  96 - 112 mEq/L   CO2 24  19 - 32 mEq/L   Glucose, Bld 99  70 - 99 mg/dL   BUN 12  6 - 23 mg/dL   Creatinine, Ser 7.82  0.50 - 1.10 mg/dL   Calcium 9.5  8.4 - 95.6 mg/dL    Total Protein 7.4  6.0 - 8.3 g/dL   Albumin 2.9 (*) 3.5 - 5.2 g/dL   AST 46 (*) 0 - 37 U/L   ALT 31  0 - 35 U/L   Alkaline Phosphatase 99  39 - 117 U/L   Total Bilirubin 0.4  0.3 - 1.2 mg/dL   GFR calc non Af Amer 72 (*) >90 mL/min   GFR calc Af Amer 84 (*) >90 mL/min    IMAGING No results found.  MAU COURSE Percocet for H/A given; Protein:creatinine ratio pending  ASSESSMENT 1. Gestational hypertension   36 yo AAF G2P2002 @6d  postop ERLTCS  PLAN C/W Dr. Claiborne [facility] in observation for serial BPs, continued evaluation for possible preE, repeat labs in am   Danae Orleans, CNM 07/15/2012 9:18 PM

## 2012-07-15 NOTE — MAU Note (Signed)
Pt also reports headache since yesterday. Bottle feeding

## 2012-07-16 LAB — COMPREHENSIVE METABOLIC PANEL
ALT: 29 U/L (ref 0–35)
AST: 39 U/L — ABNORMAL HIGH (ref 0–37)
Albumin: 2.7 g/dL — ABNORMAL LOW (ref 3.5–5.2)
CO2: 26 mEq/L (ref 19–32)
Calcium: 9.2 mg/dL (ref 8.4–10.5)
GFR calc non Af Amer: 74 mL/min — ABNORMAL LOW (ref >90)
Sodium: 139 mEq/L (ref 135–145)

## 2012-07-16 LAB — CBC
MCH: 27.9 pg (ref 26.0–34.0)
Platelets: 308 10*3/uL (ref 150–400)
RBC: 3.41 MIL/uL — ABNORMAL LOW (ref 3.87–5.11)
RDW: 14 % (ref 11.5–15.5)
WBC: 5.8 10*3/uL (ref 4.0–10.5)

## 2012-07-16 MED ORDER — LABETALOL HCL 300 MG PO TABS
300.0000 mg | ORAL_TABLET | Freq: Three times a day (TID) | ORAL | Status: DC
  Filled 2012-07-16 (×3): qty 1

## 2012-07-16 MED ORDER — NIFEDIPINE 10 MG PO CAPS: 20.0000 mg | ORAL_CAPSULE | Freq: Three times a day (TID) | ORAL | Status: DC

## 2012-07-16 MED ORDER — LABETALOL HCL 300 MG PO TABS: 300.0000 mg | ORAL_TABLET | Freq: Three times a day (TID) | ORAL | Status: DC

## 2012-07-16 MED ORDER — NIFEDIPINE ER 30 MG PO TB24
30.0000 mg | ORAL_TABLET | Freq: Once | ORAL | Status: AC
  Administered 2012-07-16: 30 mg via ORAL
  Filled 2012-07-16: qty 1

## 2012-07-16 MED ORDER — LABETALOL HCL 100 MG PO TABS
100.0000 mg | ORAL_TABLET | Freq: Once | ORAL | Status: AC
  Administered 2012-07-16: 100 mg via ORAL
  Filled 2012-07-16: qty 1

## 2012-07-16 MED ORDER — LABETALOL HCL 300 MG PO TABS
300.0000 mg | ORAL_TABLET | Freq: Three times a day (TID) | ORAL | Status: DC
  Administered 2012-07-16 – 2012-07-17 (×2): 300 mg via ORAL
  Filled 2012-07-16 (×2): qty 1

## 2012-07-16 MED ORDER — LABETALOL HCL 200 MG PO TABS
200.0000 mg | ORAL_TABLET | Freq: Once | ORAL | Status: AC
  Administered 2012-07-16: 200 mg via ORAL
  Filled 2012-07-16: qty 1

## 2012-07-16 NOTE — Progress Notes (Signed)
Pt delivered by c/s 10/31 seen by visiting nurse and noted to have elevated BPs,  Pt also reported severe headache x 2 days unrelieved by ibuprofen and percocet.  Elevated BPs also at time of delivery.  Labs normal except for mildly elevated LFT.  BPs mild range with diastolics approaching severe range.  Will monitor overnight, recheck labs in am.

## 2012-07-16 NOTE — Progress Notes (Signed)
Pt did not have adequate B/P control with the Labetalol. Given Procardia XL 30 mgs.  Will plan d/c tonight if B/P comes down.

## 2012-07-16 NOTE — H&P (Signed)
36 y.o. s/p repeat scheduled c/s 10/31.  Pt had some elevated BP's around the time of delivery and maintained mild BPs postpartum.  Was sent home stable without medication.  Visiting nurse saw pt yesterday and noted 150s/100s with pt reporting severe headache unrelieved by tylenol and ibuprofen.  Denied other symptoms.    Past Medical History  Diagnosis Date  . No pertinent past medical history    Past Surgical History  Procedure Date  . Cesarean section 2003  . Cesarean section 07/09/2012    Procedure: CESAREAN SECTION;  Surgeon: Loney Laurence, MD;  Location: [facility];  Service: Obstetrics;  Laterality: N/A;    History   Social History  . Marital Status: Single    Spouse Name: N/A    Number of Children: N/A  . Years of Education: N/A   Occupational History  . Not on file.   Social History Main Topics  . Smoking status: Never Smoker   . Smokeless tobacco: Not on file  . Alcohol Use: No  . Drug Use: No  . Sexually Active: Not Currently    Birth Control/ Protection: Other-see comments     Comment: C-section on 07/09/12   Other Topics Concern  . Not on file   Social History Narrative  . No narrative on file    No current facility-administered medications on file prior to encounter.   Current Outpatient Prescriptions on File Prior to Encounter  Medication Sig Dispense Refill  . docusate sodium (COLACE) 100 MG capsule Take 1 capsule (100 mg total) by mouth 2 (two) times daily.  60 capsule  0  . ferrous sulfate 325 (65 FE) MG EC tablet Take 325 mg by mouth 3 (three) times daily with meals.      . flintstones complete (FLINTSTONES) 60 MG chewable tablet Chew 1 tablet by mouth daily.      Marland Kitchen ibuprofen (ADVIL,MOTRIN) 600 MG tablet Take 1 tablet (600 mg total) by mouth every 6 (six) hours as needed for pain.  90 tablet  0  . oxyCODONE-acetaminophen (ROXICET) 5-325 MG per tablet Take 2 tablets by mouth every 4 (four) hours as needed for pain. May take 1-2 tablets every 4-6  hours as needed for pain  46 tablet  0    No Known Allergies  @VITALS2 @  Lungs: clear to ascultation Cor:  RRR Abdomen:  soft, nontender, nondistended. Ex:  no cords, erythema Pelvic:  def  A:  Possible severe postpartum preeclampsia   P:  Admit for BP monitoring - No MgSO4 at this time - Tylenol for HA -other routine care [place], [facility]

## 2012-07-16 NOTE — Progress Notes (Signed)
Pt received 200mg  labetalol at 10:23am and additional 100mg  labetalol at 14:00pm.    BP @ 10:00  141/91       @  12:15   149/90       @  15:52  167/98  Phoned Dr. Claiborne Billings with above information.  She stated that pt could be discharged home after getting 2 BP reading of SBP>160 and DBP>110.  Pt to RTO for BP check tomorrow.

## 2012-07-16 NOTE — Progress Notes (Signed)
Dr. Dareen Piano paged to inform of patient's BP's after dose of Procardia was given at 1940. Bp rechecked 1 and 2 hours later per MD orders- see doc flowsheets for BP values.  Md paged with last BP check, with orders to restart labetalol 300mg  TID- starting tonight at 2200. Will continue to monitor patient.

## 2012-07-16 NOTE — Progress Notes (Signed)
Pt reports HA resolved after good night sleep. Denies other complaints including RUQ pain, vision changes. Postop pain well controlled. BPs overnight showed several severe ranges, mild BPs this morning.  Labetalol 200mg  started this am first dose at 10am.  No change in BP 2 hrs later. Labs essentially normal.  PLTs normal, slight elevation of LFTs now trending down. Urine result inconclusive - 24 hr protein result reported, however this test not actually performed.  +prot noted however Will increase Labetalol to 300 tid and monitor for several more hours.  If no further severe ranges and no other symptoms will d/c home with RX for Labetalol with instruction to check BP several times over weekend and call Dr. On call if any elevations noted or if PEC symptoms develop.

## 2012-07-16 NOTE — Progress Notes (Signed)
UR Chart review completed.  

## 2012-07-16 NOTE — Discharge Summary (Signed)
  Pt admitted for observation for elevated BPs and severe HA, s/p repeat scheduled c/s 10/31.  Labs essentially normal, protein noted in urine.  Pt started on labetalol 300tid for several severe range BPs overnight.  Instructed to check BPs bid over weekend and call if elevated in severe range.  Pt states she'll be able to do this.  Discussed PEC at length.  Pt will f/u in office with me 1 week.

## 2012-07-17 MED ORDER — LABETALOL HCL 300 MG PO TABS: 300.0000 mg | ORAL_TABLET | Freq: Three times a day (TID) | ORAL | Status: DC

## 2012-07-17 MED ORDER — NIFEDIPINE ER OSMOTIC RELEASE 30 MG PO TB24: 30.0000 mg | ORAL_TABLET | Freq: Every day | ORAL | Status: DC

## 2012-07-17 NOTE — Discharge Summary (Signed)
Physician Discharge Summary  Patient ID: Suzanne Simpson MRN: [id number] DOB/AGE: 11/28/1975 36 y.o.  Admit date: 07/15/2012 Discharge date: 07/17/2012  Admission Diagnoses: Post partum hypertension  Discharge Diagnoses: Post partum hypertension Active Problems:  * No active hospital problems. *    Discharged Condition: good  Hospital Course: Observed for 36 hours with resolution of hypertension on appropriate antihypertensive medication.  Consults: None  Significant Diagnostic Studies: labs: Normal PIH labs  Treatments: Procardia and labetalol  Discharge Exam: Blood pressure 128/76, pulse 73, temperature 98.7 F (37.1 C), temperature source Oral, resp. rate 19, height 5\' 7"  (1.702 m), weight 230 lb 6.4 oz (104.509 kg), last menstrual period 09/29/2011, SpO2 99.00%, not currently breastfeeding.   Disposition: 01-Home or Self Care     Medication List     As of 07/17/2012  9:04 AM    TAKE these medications         docusate sodium 100 MG capsule   Commonly known as: COLACE   Take 1 capsule (100 mg total) by mouth 2 (two) times daily.      ferrous sulfate 325 (65 FE) MG EC tablet   Take 325 mg by mouth 3 (three) times daily with meals.      flintstones complete 60 MG chewable tablet   Chew 1 tablet by mouth daily.      ibuprofen 600 MG tablet   Commonly known as: ADVIL,MOTRIN   Take 1 tablet (600 mg total) by mouth every 6 (six) hours as needed for pain.                  labetalol 300 MG tablet   Commonly known as: NORMODYNE   Take 1 tablet (300 mg total) by mouth 3 (three) times daily.      NIFEdipine 30 MG 24 hr tablet   Commonly known as: PROCARDIA-XL/ADALAT-CC/NIFEDICAL-XL   Take 1 tablet (30 mg total) by mouth daily.      oxyCODONE-acetaminophen 5-325 MG per tablet   Commonly known as: PERCOCET/ROXICET   Take 2 tablets by mouth every 4 (four) hours as needed for pain. May take 1-2 tablets every 4-6 hours as needed for pain           Follow-up  Information    Follow up with CALLAHAN, SIDNEY, DO. In 1 week.   Contact information:   [address]    [place] [address] [phone]          Signed: Mickel Baas 07/17/2012, 9:04 AM

## 2012-07-17 NOTE — Progress Notes (Signed)
Pt discharged to home with fiancee.  Condition stable.  Pt ambulated to car with Stevphen Meuse, NT.  No equipment for home ordered at discharge.

## 2012-08-07 ENCOUNTER — Emergency Department (INDEPENDENT_AMBULATORY_CARE_PROVIDER_SITE_OTHER)
Admission: EM | Admit: 2012-08-07 | Discharge: 2012-08-07 | Disposition: A | Discharge status: Home / Self Care | Payer: BC Managed Care – PPO | Source: Home / Self Care | Attending: Emergency Medicine | Admitting: Emergency Medicine

## 2012-08-07 ENCOUNTER — Encounter (HOSPITAL_COMMUNITY): Payer: Self-pay | Admitting: *Deleted

## 2012-08-07 DIAGNOSIS — J069 Acute upper respiratory infection, unspecified: Secondary | ICD-10-CM

## 2012-08-07 MED ORDER — NAPROXEN 500 MG PO TABS: 500.0000 mg | ORAL_TABLET | Freq: Two times a day (BID) | ORAL | Status: DC

## 2012-08-07 MED ORDER — BENZONATATE 200 MG PO CAPS: 200.0000 mg | ORAL_CAPSULE | Freq: Three times a day (TID) | ORAL | Status: DC | PRN

## 2012-08-07 NOTE — ED Notes (Signed)
C/O productive cough, sore throat since yesterday without fevers.  Has not taken any meds for symptoms.  Pt is 4 wks postpartum, not breastfeeding.

## 2012-08-07 NOTE — ED Provider Notes (Signed)
Chief Complaint  Patient presents with  . Cough  . Sore Throat    History of Present Illness:   The patient is a 36 year old female who has had a two-day history of nasal congestion with clear drainage, sneezing, dry cough, and sore throat. She denies any fever, chills, headache, earache, chest tightness, wheezing, chest pain, or GI symptoms. She has been exposed her infant son who has the same symptoms.  Review of Systems:  Other than noted above, the patient denies any of the following symptoms. Systemic:  No fever, chills, sweats, fatigue, myalgias, headache, or anorexia. Eye:  No redness, pain or drainage. ENT:  No earache, ear congestion, nasal congestion, sneezing, rhinorrhea, sinus pressure, sinus pain, post nasal drip, or sore throat. Lungs:  No cough, sputum production, wheezing, shortness of breath, or chest pain. GI:  No abdominal pain, nausea, vomiting, or diarrhea.  PMFSH:  Past medical history, family history, social history, meds, and allergies were reviewed.  Physical Exam:   Vital signs:  BP 136/86  Pulse 70  Temp 98.8 F (37.1 C)  Resp 16  SpO2 97%  Breastfeeding? No General:  Alert, in no distress. Eye:  No conjunctival injection or drainage. Lids were normal. ENT:  TMs and canals were normal, without erythema or inflammation.  Nasal mucosa was clear and uncongested, without drainage.  Mucous membranes were moist.  Pharynx was clear, without exudate or drainage.  There were no oral ulcerations or lesions. Neck:  Supple, no adenopathy, tenderness or mass. Lungs:  No respiratory distress.  Lungs were clear to auscultation, without wheezes, rales or rhonchi.  Breath sounds were clear and equal bilaterally.  Heart:  Regular rhythm, without gallops, murmers or rubs. Skin:  Clear, warm, and dry, without rash or lesions.  Assessment:  The encounter diagnosis was Viral upper respiratory infection.  Plan:   1.  The following meds were prescribed:   New Prescriptions   BENZONATATE (TESSALON) 200 MG CAPSULE    Take 1 capsule (200 mg total) by mouth 3 (three) times daily as needed for cough.   NAPROXEN (NAPROSYN) 500 MG TABLET    Take 1 tablet (500 mg total) by mouth 2 (two) times daily.   2.  The patient was instructed in symptomatic care and handouts were given. 3.  The patient was told to return if becoming worse in any way, if no better in 3 or 4 days, and given some red flag symptoms that would indicate earlier return.   Reuben Likes, MD 08/07/12 2034

## 2013-02-09 ENCOUNTER — Emergency Department (HOSPITAL_COMMUNITY)
Admission: EM | Admit: 2013-02-09 | Discharge: 2013-02-09 | Disposition: A | Discharge status: Home / Self Care | Payer: Medicaid Other | Source: Home / Self Care | Attending: Emergency Medicine | Admitting: Emergency Medicine

## 2013-02-09 ENCOUNTER — Encounter (HOSPITAL_COMMUNITY): Payer: Self-pay | Admitting: Emergency Medicine

## 2013-02-09 DIAGNOSIS — J020 Streptococcal pharyngitis: Secondary | ICD-10-CM

## 2013-02-09 DIAGNOSIS — I10 Essential (primary) hypertension: Secondary | ICD-10-CM

## 2013-02-09 MED ORDER — IBUPROFEN 800 MG PO TABS
800.0000 mg | ORAL_TABLET | Freq: Once | ORAL | Status: AC
  Administered 2013-02-09: 800 mg via ORAL

## 2013-02-09 MED ORDER — LABETALOL HCL 300 MG PO TABS: ORAL_TABLET | ORAL | Status: DC

## 2013-02-09 MED ORDER — HYDROCODONE-ACETAMINOPHEN 5-325 MG PO TABS: ORAL_TABLET | ORAL | Status: DC

## 2013-02-09 MED ORDER — NIFEDIPINE ER OSMOTIC RELEASE 30 MG PO TB24: 30.0000 mg | ORAL_TABLET | Freq: Every day | ORAL | Status: DC

## 2013-02-09 MED ORDER — IBUPROFEN 800 MG PO TABS
ORAL_TABLET | ORAL | Status: AC
  Filled 2013-02-09: qty 1

## 2013-02-09 MED ORDER — PENICILLIN G BENZATHINE 1200000 UNIT/2ML IM SUSP
1.2000 10*6.[IU] | Freq: Once | INTRAMUSCULAR | Status: AC
  Administered 2013-02-09: 1.2 10*6.[IU] via INTRAMUSCULAR

## 2013-02-09 MED ORDER — PENICILLIN G BENZATHINE 1200000 UNIT/2ML IM SUSP
INTRAMUSCULAR | Status: AC
  Filled 2013-02-09: qty 2

## 2013-02-09 NOTE — ED Provider Notes (Signed)
Chief Complaint:   Chief Complaint  Patient presents with  . Sore Throat    History of Present Illness:   Suzanne Simpson is a 37 year old female who's had a 3 to four-day history of severe sore throat, pain on swallowing with radiation to both jaw areas, headache, although this is a chronic, daily headache which has been going on for years, dry cough productive yellow sputum, and some nausea. She denies any fever or chills. She's had no nasal congestion or rhinorrhea. No vomiting or diarrhea. No known exposure to strep or  She also has had high blood pressure for years. She's been off her meds for several weeks. She normally takes labetalol and Procardia. She did not have any medication side effects from these and denies any dizziness, blurred vision, shortness of breath, or chest pain.  Review of Systems:  Other than as noted above, the patient denies any of the following symptoms. Systemic:  No fever, chills, sweats, fatigue, myalgias, headache, or anorexia. Eye:  No redness, pain or drainage. ENT:  No earache, ear congestion, nasal congestion, sneezing, rhinorrhea, sinus pressure, sinus pain, or post nasal drip. Lungs:  No cough, sputum production, wheezing, shortness of breath, or chest pain. GI:  No abdominal pain, nausea, vomiting, or diarrhea. Skin:  No rash or itching.  PMFSH:  Past medical history, family history, social history, meds, allergies, and nurse's notes were reviewed.  There is no known exposure to strep or mono.  No prior history of step or mono.  The patient denies use of tobacco.  Physical Exam:   Vital signs:  BP 142/103  Pulse 69  Temp(Src) 99.6 F (37.6 C) (Oral)  Resp 18  SpO2 100% General:  Alert, in no distress. Eye:  No conjunctival injection or drainage. Lids were normal. ENT:  The left TM was injected but there was no pus or fluid behind the TM and the canal is clear, right TM and canal are normal.  Nasal mucosa was clear and uncongested, without drainage.   Mucous membranes were moist.  Exam of pharynx reveals slight erythema, no swelling or exudate.  There were no oral ulcerations or lesions. Neck:  Supple, no adenopathy, tenderness or mass. Lungs:  No respiratory distress.  Lungs were clear to auscultation, without wheezes, rales or rhonchi.  Breath sounds were clear and equal bilaterally.  Heart:  Regular rhythm, without gallops, murmers or rubs. Skin:  Clear, warm, and dry, without rash or lesions.  Labs:   Results for orders placed during the hospital encounter of 02/09/13  POCT RAPID STREP A ([facility] ONLY)      Result Value Range   Streptococcus, Group A Screen (Direct) POSITIVE (*) NEGATIVE    Course in Urgent Care Center:   Given LA Bicillin 1.2 million units IM.  Assessment:  The primary encounter diagnosis was Strep throat. A diagnosis of Hypertension was also pertinent to this visit.  She has uncomplicated strep throat, although her throat does not look all that bad. There is no evidence for a peritonsillar abscess. Her blood pressure is a little elevated today and she's been off her medicines for several weeks. She hasn't appointment next week to see her primary care physician, Dr. Paulina Fusi.  Plan:   1.  The following meds were prescribed:   New Prescriptions   HYDROCODONE-ACETAMINOPHEN (NORCO/VICODIN) 5-325 MG PER TABLET    1 to 2 tabs every 4 to 6 hours as needed for pain.   LABETALOL (NORMODYNE) 300 MG TABLET  1 TID   NIFEDIPINE (PROCARDIA XL) 30 MG 24 HR TABLET    Take 1 tablet (30 mg total) by mouth daily.   2.  The patient was instructed in symptomatic care including hot saline gargles, throat lozenges, infectious precautions, and need to trade out toothbrush. Handouts were given. 3.  The patient was told to return if becoming worse in any way, if no better in 3 or 4 days, and given some red flag symptoms such as difficulty breathing or swallowing that would indicate earlier return. 4.  Follow up with Dr. Paulina Fusi in one  week.    Reuben Likes, MD 02/09/13 [phone]

## 2013-02-09 NOTE — ED Notes (Signed)
Reports no relief from past 3 days using OTC medications for her sore throat

## 2013-02-15 ENCOUNTER — Encounter: Payer: Self-pay | Admitting: Family Medicine

## 2013-02-15 ENCOUNTER — Ambulatory Visit (INDEPENDENT_AMBULATORY_CARE_PROVIDER_SITE_OTHER): Payer: Medicaid Other | Admitting: Family Medicine

## 2013-02-15 VITALS — BP 144/92 | HR 88 | Temp 98.7°F | Ht 67.0 in | Wt 233.0 lb

## 2013-02-15 DIAGNOSIS — I10 Essential (primary) hypertension: Secondary | ICD-10-CM | POA: Insufficient documentation

## 2013-02-15 DIAGNOSIS — R030 Elevated blood-pressure reading, without diagnosis of hypertension: Secondary | ICD-10-CM

## 2013-02-15 NOTE — Progress Notes (Signed)
Suzanne Simpson is a 37 y.o. who presents today for physical.  Pt with no complaints at this time.  PMHx significant for PIH in which she was on Labetalol/Procardia for premature contractions but otherwise does not take medications or have health concerns.  Does have FMHx of HTN/DM/Hyperlipidemia.   Past Medical History  Diagnosis Date  . No pertinent past medical history   . Pregnancy induced hypertension     History   Social History  . Marital Status: Single    Spouse Name: N/A    Number of Children: N/A  . Years of Education: N/A   Occupational History  . Not on file.   Social History Main Topics  . Smoking status: Never Smoker   . Smokeless tobacco: Never Used  . Alcohol Use: 0.6 oz/week    1 Glasses of wine per week  . Drug Use: No  . Sexually Active: Not Currently    Birth Control/ Protection: Other-see comments     Comment: C-section on 07/09/12   Other Topics Concern  . Not on file   Social History Narrative  . No narrative on file    Family History  Problem Relation Age of Onset  . Diabetes Mother   . Hypertension Mother   . Hyperlipidemia Father   . Hypertension Maternal Aunt   . Diabetes Maternal Aunt     No current outpatient prescriptions on file prior to visit.   No current facility-administered medications on file prior to visit.    Patient Information Form: Screening and ROS  AUDIT-C Score: 0 Do you feel safe in relationships? yes PHQ-2:negative  Review of Symptoms  General:  Negative for nexplained weight loss, fever Skin: Negative for new or changing mole, sore that won't heal HEENT: Negative for trouble hearing, trouble seeing, ringing in ears, mouth sores, hoarseness, change in voice, dysphagia. CV:  Negative for chest pain, dyspnea, edema, palpitations Resp: Negative for cough, dyspnea, hemoptysis GI: Negative for nausea, vomiting, diarrhea, constipation, abdominal pain, melena, hematochezia. GU: Negative for dysuria, incontinence,  urinary hesitance, hematuria, vaginal or penile discharge, polyuria, sexual difficulty, lumps in testicle or breasts MSK: Negative for muscle cramps or aches, joint pain or swelling Neuro: + for HA , Negative for , weakness, numbness, dizziness, passing out/fainting Psych: Negative for depression, anxiety, memory problems  Physical Exam Filed Vitals:   02/15/13 1006  BP: 144/92  Pulse: 88  Temp: 98.7 F (37.1 C)    Gen: NAD, Well nourished, Well developed HEENT: PERLA, EOMI, [place]/AT Neck: no JVD Cardio: RRR, No murmurs/gallops/rubs Lungs: CTA, no wheezes, rhonchi, crackles Abd: NABS, soft nontender nondistended MSK: ROM normal  Neuro: No focal deficits  Psych: AAO x 3

## 2013-02-15 NOTE — Assessment & Plan Note (Signed)
Pt BP recheck at 142/88.  Does not want to take medications at this time and will revisit at 6 months.  Went over diet and exercise along with graphing her weight to give motivation for her to lose weight as well as eat healthy.  If at next visit, continues to be > 140/90, will start on Norvasc/HCTZ since AA and no CKD noted.  Will get BMET at this time as well as Lipid Panel, A1C screen.

## 2013-02-15 NOTE — Patient Instructions (Signed)
Suzanne Simpson, it was nice seeing you today.  We discussed your blood pressure and ways to avoid taking a medication.  1) We will see you back in 6 months, and at that time will do some labwork to check for diabetes and cholesterol.  2) We will repeat your blood pressure and see what it is it.  During that time, it is up to you to work on diet/exercise as you are able to, in order to keep your blood pressure down and to lose weight.  Remember, this is a process/marathon and to keep your head up for this as there will be times that you will most likely put on weight or feel discouraged.  Today's weight was 233, and we will check this again in 6 months.  If you have any questions or need further help, please give [place] a call.   Thank you, Dr. Paulina Fusi

## 2013-04-22 ENCOUNTER — Ambulatory Visit (INDEPENDENT_AMBULATORY_CARE_PROVIDER_SITE_OTHER): Payer: Medicaid Other

## 2013-04-22 ENCOUNTER — Encounter: Payer: Self-pay | Admitting: Obstetrics & Gynecology

## 2013-05-05 ENCOUNTER — Inpatient Hospital Stay (HOSPITAL_COMMUNITY): Payer: Medicaid Other

## 2013-05-05 ENCOUNTER — Inpatient Hospital Stay (HOSPITAL_COMMUNITY)
Admission: AD | Admit: 2013-05-05 | Discharge: 2013-05-05 | Disposition: A | Discharge status: Home / Self Care | Payer: Medicaid Other | Source: Ambulatory Visit | Attending: Family Medicine | Admitting: Family Medicine

## 2013-05-05 ENCOUNTER — Encounter (HOSPITAL_COMMUNITY): Payer: Self-pay

## 2013-05-05 DIAGNOSIS — O200 Threatened abortion: Secondary | ICD-10-CM

## 2013-05-05 DIAGNOSIS — O209 Hemorrhage in early pregnancy, unspecified: Secondary | ICD-10-CM

## 2013-05-05 DIAGNOSIS — O26859 Spotting complicating pregnancy, unspecified trimester: Secondary | ICD-10-CM | POA: Insufficient documentation

## 2013-05-05 DIAGNOSIS — R109 Unspecified abdominal pain: Secondary | ICD-10-CM | POA: Insufficient documentation

## 2013-05-05 LAB — URINALYSIS, ROUTINE W REFLEX MICROSCOPIC
Glucose, UA: NEGATIVE mg/dL
Ketones, ur: NEGATIVE mg/dL
Protein, ur: 30 mg/dL — AB
Urobilinogen, UA: 1 mg/dL (ref 0.0–1.0)

## 2013-05-05 LAB — URINE MICROSCOPIC-ADD ON

## 2013-05-05 LAB — CBC WITH DIFFERENTIAL/PLATELET
Basophils Absolute: 0 10*3/uL (ref 0.0–0.1)
Basophils Relative: 0 % (ref 0–1)
Eosinophils Relative: 1 % (ref 0–5)
HCT: 33.5 % — ABNORMAL LOW (ref 36.0–46.0)
MCHC: 31.6 g/dL (ref 30.0–36.0)
MCV: 84.4 fL (ref 78.0–100.0)
Monocytes Absolute: 0.6 10*3/uL (ref 0.1–1.0)
RDW: 14.8 % (ref 11.5–15.5)

## 2013-05-05 MED ORDER — LABETALOL HCL 100 MG PO TABS: 100.0000 mg | ORAL_TABLET | Freq: Two times a day (BID) | ORAL | Status: DC

## 2013-05-05 MED ORDER — LABETALOL HCL 100 MG PO TABS
100.0000 mg | ORAL_TABLET | Freq: Once | ORAL | Status: AC
  Administered 2013-05-05: 100 mg via ORAL
  Filled 2013-05-05: qty 1

## 2013-05-05 NOTE — MAU Note (Signed)
Patient states she started spotting last night but light pink bleeding today with abdominal cramping. Denies nausea or vomiting.

## 2013-05-05 NOTE — MAU Provider Note (Signed)
History     CSN: [id number]  Arrival date and time: 05/05/13 1832   None     Chief Complaint  Patient presents with  . Abdominal Pain  . Vaginal Bleeding   HPI Suzanne Simpson is 37 y.o. G3P1002 [redacted]w[redacted]d weeks presenting with spotting yesterday today like a light period. Had cramping today at work.  Denies other sxs.   LMP 03/11/13.  Patient of Delle Reining with last pregnancy-delivered 07/09/12.  Developed hypertension post partum with that pregnancy.  She took labetalol and another med for approx 8 months until she saw her PCP in June.  She reports that he told her BP was normal and she could d/c med and control diet and exercise.  She denies UTI sxs.     Past Medical History  Diagnosis Date  . No pertinent past medical history   . Pregnancy induced hypertension     Past Surgical History  Procedure Laterality Date  . Cesarean section  2003  . Cesarean section  07/09/2012    Procedure: CESAREAN SECTION;  Surgeon: Loney Laurence, MD;  Location: [facility];  Service: Obstetrics;  Laterality: N/A;  . Achilles tendon repair      Family History  Problem Relation Age of Onset  . Diabetes Mother   . Hypertension Mother   . Hyperlipidemia Father   . Hypertension Maternal Aunt   . Diabetes Maternal Aunt     History  Substance Use Topics  . Smoking status: Never Smoker   . Smokeless tobacco: Never Used  . Alcohol Use: No    Allergies: No Known Allergies  Prescriptions prior to admission  Medication Sig Dispense Refill  . acetaminophen (TYLENOL) 325 MG tablet Take 650 mg by mouth every 6 (six) hours as needed for pain.        Review of Systems  Constitutional: Negative for fever and chills.  Cardiovascular:       Elevated blood pressure  Gastrointestinal: Positive for abdominal pain (mild cramping).  Genitourinary: Negative for dysuria, urgency and frequency.       + for vaginal bleeding  Neurological: Negative for headaches.   Physical Exam   Blood pressure 153/93,  pulse 67, temperature 98.6 F (37 C), temperature source Oral, resp. rate 18, height 5\' 6"  (1.676 m), weight 234 lb 6.4 oz (106.323 kg), last menstrual period 03/11/2013, SpO2 100.00%.  Physical Exam  Vitals reviewed. Constitutional: She is oriented to person, place, and time. She appears well-developed and well-nourished. No distress.  HENT:  Head: Normocephalic.  Neck: Normal range of motion.  Cardiovascular: Normal rate.   Respiratory: Effort normal.  GI: Soft. She exhibits no distension and no mass. There is no tenderness. There is no rebound and no guarding.  Genitourinary: There is no rash, tenderness or lesion on the right labia. There is no rash, tenderness or lesion on the left labia. Uterus is enlarged (slightly, difficult to full assess bc large abdomen). Uterus is not tender. Cervix exhibits no motion tenderness, no discharge and no friability. Right adnexum displays no mass, no tenderness and no fullness. Left adnexum displays no mass, no tenderness and no fullness. There is bleeding (small amount of pinkish brown blood without clot) around the vagina. No erythema or tenderness around the vagina. No vaginal discharge found.  Neurological: She is alert and oriented to person, place, and time.  Skin: Skin is warm and dry.  Psychiatric: She has a normal mood and affect. Her behavior is normal.   Results for orders placed during  the hospital encounter of 05/05/13 (from the past 24 hour(s))  URINALYSIS, ROUTINE W REFLEX MICROSCOPIC     Status: Abnormal   Collection Time    05/05/13  6:50 PM      Result Value Range   Color, Urine YELLOW  YELLOW   APPearance TURBID (*) CLEAR   Specific Gravity, Urine >1.030 (*) 1.005 - 1.030   pH 6.0  5.0 - 8.0   Glucose, UA NEGATIVE  NEGATIVE mg/dL   Hgb urine dipstick LARGE (*) NEGATIVE   Bilirubin Urine NEGATIVE  NEGATIVE   Ketones, ur NEGATIVE  NEGATIVE mg/dL   Protein, ur 30 (*) NEGATIVE mg/dL   Urobilinogen, UA 1.0  0.0 - 1.0 mg/dL    Nitrite NEGATIVE  NEGATIVE   Leukocytes, UA SMALL (*) NEGATIVE  URINE MICROSCOPIC-ADD ON     Status: Abnormal   Collection Time    05/05/13  6:50 PM      Result Value Range   Squamous Epithelial / LPF FEW (*) RARE   WBC, UA 21-50  <3 WBC/hpf   RBC / HPF TOO NUMEROUS TO COUNT  <3 RBC/hpf   Bacteria, UA MANY (*) RARE  HCG, QUANTITATIVE, PREGNANCY     Status: Abnormal   Collection Time    05/05/13  7:09 PM      Result Value Range   hCG, Beta Chain, Quant, S 816 (*) <5 mIU/mL  CBC WITH DIFFERENTIAL     Status: Abnormal   Collection Time    05/05/13  7:10 PM      Result Value Range   WBC 9.1  4.0 - 10.5 K/uL   RBC 3.97  3.87 - 5.11 MIL/uL   Hemoglobin 10.6 (*) 12.0 - 15.0 g/dL   HCT 84.6 (*) 96.2 - 95.2 %   MCV 84.4  78.0 - 100.0 fL   MCH 26.7  26.0 - 34.0 pg   MCHC 31.6  30.0 - 36.0 g/dL   RDW 84.1  32.4 - 40.1 %   Platelets 194  150 - 400 K/uL   Neutrophils Relative % 66  43 - 77 %   Neutro Abs 6.0  1.7 - 7.7 K/uL   Lymphocytes Relative 27  12 - 46 %   Lymphs Abs 2.4  0.7 - 4.0 K/uL   Monocytes Relative 7  3 - 12 %   Monocytes Absolute 0.6  0.1 - 1.0 K/uL   Eosinophils Relative 1  0 - 5 %   Eosinophils Absolute 0.1  0.0 - 0.7 K/uL   Basophils Relative 0  0 - 1 %   Basophils Absolute 0.0  0.0 - 0.1 K/uL  WET PREP, GENITAL     Status: Abnormal   Collection Time    05/05/13  7:23 PM      Result Value Range   Yeast Wet Prep HPF POC NONE SEEN  NONE SEEN   Trich, Wet Prep NONE SEEN  NONE SEEN   Clue Cells Wet Prep HPF POC NONE SEEN  NONE SEEN   WBC, Wet Prep HPF POC MODERATE (*) NONE SEEN    BLOOD TYPE from previous record  A Positive   US Ob Comp Less 14 Wks  05/05/2013   *RADIOLOGY REPORT*  Clinical Data: Spotting and cramping, beta HCG - 816  OBSTETRIC <14 WK [place] AND TRANSVAGINAL OB US  Technique:  Both transabdominal and transvaginal ultrasound examinations were performed for complete evaluation of the gestation as well as the maternal uterus, adnexal regions, and pelvic  cul-de-sac.  Transvaginal technique was performed to assess early pregnancy.  Comparison:  None.  Findings:  Intrauterine gestational sac:  None visualized  Maternal uterus/adnexae:  Uterus:  There is an approximately 1.2 x 0.8 cm anechoic structure within the endometrium which does not definitely demonstrate an adjacent decidual reaction and whose borders appear slightly irregular.  As such, this structure cannot be considered a normal appearing intrauterine gestational sac.  Otherwise, normal appearance of the anteverted uterus.  No discrete uterine mass. The endometrial stripe appears thickened measuring approximately 2.8 cm in diameter (image 33).  Right ovary:  2.7 x 2.4 x 1.5 cm.  No discrete right ovarian or adnexal mass.  Left ovary:  Only seen on provided transabdominal images; normal in size measuring 1.9 x 2.4 x 2.0 cm.  No discrete left-sided ovarian or adnexal lesion.  No definitive free fluid within the pelvic cul-de-sac.  IMPRESSION: Nonvisualization of an intrauterine gestational sac.  Differential considerations include early viable intrauterine pregnancy, failed intrauterine pregnancy or ectopic.  Correlation with serial beta HCGs and possible followup pelvic ultrasound is recommended.   Original Report Authenticated By: Tacey Ruiz, MD   US Ob Transvaginal  05/05/2013   *RADIOLOGY REPORT*  Clinical Data: Spotting and cramping, beta HCG - 816  OBSTETRIC <14 WK [place] AND TRANSVAGINAL OB US  Technique:  Both transabdominal and transvaginal ultrasound examinations were performed for complete evaluation of the gestation as well as the maternal uterus, adnexal regions, and pelvic cul-de-sac.  Transvaginal technique was performed to assess early pregnancy.  Comparison:  None.  Findings:  Intrauterine gestational sac:  None visualized  Maternal uterus/adnexae:  Uterus:  There is an approximately 1.2 x 0.8 cm anechoic structure within the endometrium which does not definitely demonstrate an adjacent  decidual reaction and whose borders appear slightly irregular.  As such, this structure cannot be considered a normal appearing intrauterine gestational sac.  Otherwise, normal appearance of the anteverted uterus.  No discrete uterine mass. The endometrial stripe appears thickened measuring approximately 2.8 cm in diameter (image 33).  Right ovary:  2.7 x 2.4 x 1.5 cm.  No discrete right ovarian or adnexal mass.  Left ovary:  Only seen on provided transabdominal images; normal in size measuring 1.9 x 2.4 x 2.0 cm.  No discrete left-sided ovarian or adnexal lesion.  No definitive free fluid within the pelvic cul-de-sac.  IMPRESSION: Nonvisualization of an intrauterine gestational sac.  Differential considerations include early viable intrauterine pregnancy, failed intrauterine pregnancy or ectopic.  Correlation with serial beta HCGs and possible followup pelvic ultrasound is recommended.   Original Report Authenticated By: Tacey Ruiz, MD  MAU Course  Procedures  GC/CHL culture to lab                      Urine culture pending  MDM  BP at time of discharge is 158/93.  She has been off her BP med X 2 months.  Will start back at a low dose.              Labetalol 100mg  po given in MAU  I reviewed at length the lab and U/S findings with the patient and gave her ectopic/threatened miscarriage instructions.  Instructed her to begin prenatal vitamins Assessment and Plan  A:  Vaginal bleeding in early pregnancy      Threatened miscarriage      Size less than expected by LMP      Hypertension      Anemia  P:  Patient was instructed to return in 48 hrs for repeat lab work     Patient is aware urine culture is pending      Return for increased pain or heavy bleeding--ectopic precautions      PELVIC REST     Rx for Labetalol 100mg  bid        Instructed to begin prenatal vitamins OTC      Tylenol prn cramping             KEY,EVE M 05/05/2013, 8:09 PM

## 2013-05-06 LAB — GC/CHLAMYDIA PROBE AMP: GC Probe RNA: NEGATIVE

## 2013-05-07 ENCOUNTER — Encounter (HOSPITAL_COMMUNITY): Payer: Self-pay | Admitting: *Deleted

## 2013-05-07 ENCOUNTER — Inpatient Hospital Stay (HOSPITAL_COMMUNITY)
Admission: AD | Admit: 2013-05-07 | Discharge: 2013-05-07 | Disposition: A | Discharge status: Home / Self Care | Payer: Medicaid Other | Source: Ambulatory Visit | Attending: Obstetrics & Gynecology | Admitting: Obstetrics & Gynecology

## 2013-05-07 DIAGNOSIS — O039 Complete or unspecified spontaneous abortion without complication: Secondary | ICD-10-CM | POA: Insufficient documentation

## 2013-05-07 DIAGNOSIS — R109 Unspecified abdominal pain: Secondary | ICD-10-CM | POA: Insufficient documentation

## 2013-05-07 LAB — HCG, QUANTITATIVE, PREGNANCY: hCG, Beta Chain, Quant, S: 205 m[IU]/mL — ABNORMAL HIGH (ref <5)

## 2013-05-07 MED ORDER — IBUPROFEN 800 MG PO TABS: 800.0000 mg | ORAL_TABLET | Freq: Three times a day (TID) | ORAL | Status: DC | PRN

## 2013-05-07 NOTE — MAU Provider Note (Signed)
History   Chief Complaint:  No chief complaint on file.   Suzanne Simpson is  37 y.o. G3P1002 Patient's last menstrual period was 03/11/2013.Marland Kitchen Patient is here for follow up of quantitative HCG and ongoing surveillance of pregnancy status.   She is [redacted]w[redacted]d weeks gestation  by LMP.    Since her last visit, the patient states her bleeding has increased from pink and light to "like a period". Cramping has increased.  General ROS:  positive for vaginal bleeding and cramping  Her previous Quantitative HCG values are: 05/05/13: 816    Physical Exam   Blood pressure 129/85, pulse 74, temperature 98.5 F (36.9 C), temperature source Oral, resp. rate 20, height 5\' 6"  (1.676 m), weight 235 lb 8 oz (106.822 kg), last menstrual period 03/11/2013.    Labs: Results for orders placed during the hospital encounter of 05/07/13 (from the past 24 hour(s))  HCG, QUANTITATIVE, PREGNANCY   Collection Time    05/07/13  7:50 PM      Result Value Range   hCG, Beta Chain, Quant, S 205 (*) <5 mIU/mL    Ultrasound Studies:   US Ob Comp Less 14 Wks  05/05/2013   *RADIOLOGY REPORT*  Clinical Data: Spotting and cramping, beta HCG - 816  OBSTETRIC <14 WK [place] AND TRANSVAGINAL OB US  Technique:  Both transabdominal and transvaginal ultrasound examinations were performed for complete evaluation of the gestation as well as the maternal uterus, adnexal regions, and pelvic cul-de-sac.  Transvaginal technique was performed to assess early pregnancy.  Comparison:  None.  Findings:  Intrauterine gestational sac:  None visualized  Maternal uterus/adnexae:  Uterus:  There is an approximately 1.2 x 0.8 cm anechoic structure within the endometrium which does not definitely demonstrate an adjacent decidual reaction and whose borders appear slightly irregular.  As such, this structure cannot be considered a normal appearing intrauterine gestational sac.  Otherwise, normal appearance of the anteverted uterus.  No discrete uterine mass. The  endometrial stripe appears thickened measuring approximately 2.8 cm in diameter (image 33).  Right ovary:  2.7 x 2.4 x 1.5 cm.  No discrete right ovarian or adnexal mass.  Left ovary:  Only seen on provided transabdominal images; normal in size measuring 1.9 x 2.4 x 2.0 cm.  No discrete left-sided ovarian or adnexal lesion.  No definitive free fluid within the pelvic cul-de-sac.  IMPRESSION: Nonvisualization of an intrauterine gestational sac.  Differential considerations include early viable intrauterine pregnancy, failed intrauterine pregnancy or ectopic.  Correlation with serial beta HCGs and possible followup pelvic ultrasound is recommended.   Original Report Authenticated By: Tacey Ruiz, MD   US Ob Transvaginal  05/05/2013   *RADIOLOGY REPORT*  Clinical Data: Spotting and cramping, beta HCG - 816  OBSTETRIC <14 WK [place] AND TRANSVAGINAL OB US  Technique:  Both transabdominal and transvaginal ultrasound examinations were performed for complete evaluation of the gestation as well as the maternal uterus, adnexal regions, and pelvic cul-de-sac.  Transvaginal technique was performed to assess early pregnancy.  Comparison:  None.  Findings:  Intrauterine gestational sac:  None visualized  Maternal uterus/adnexae:  Uterus:  There is an approximately 1.2 x 0.8 cm anechoic structure within the endometrium which does not definitely demonstrate an adjacent decidual reaction and whose borders appear slightly irregular.  As such, this structure cannot be considered a normal appearing intrauterine gestational sac.  Otherwise, normal appearance of the anteverted uterus.  No discrete uterine mass. The endometrial stripe appears thickened measuring approximately 2.8 cm in  diameter (image 33).  Right ovary:  2.7 x 2.4 x 1.5 cm.  No discrete right ovarian or adnexal mass.  Left ovary:  Only seen on provided transabdominal images; normal in size measuring 1.9 x 2.4 x 2.0 cm.  No discrete left-sided ovarian or adnexal lesion.   No definitive free fluid within the pelvic cul-de-sac.  IMPRESSION: Nonvisualization of an intrauterine gestational sac.  Differential considerations include early viable intrauterine pregnancy, failed intrauterine pregnancy or ectopic.  Correlation with serial beta HCGs and possible followup pelvic ultrasound is recommended.   Original Report Authenticated By: Tacey Ruiz, MD    Assessment: 1. Miscarriage   Rev'd precautions, Motrin for pain, f/u in 1 week for repeat quant or sooner PRN    Medication List         acetaminophen 325 MG tablet  Commonly known as:  TYLENOL  Take 650 mg by mouth every 6 (six) hours as needed for pain.     ibuprofen 800 MG tablet  Commonly known as:  ADVIL,MOTRIN  Take 1 tablet (800 mg total) by mouth every 8 (eight) hours as needed for pain.     labetalol 100 MG tablet  Commonly known as:  NORMODYNE  Take 1 tablet (100 mg total) by mouth 2 (two) times daily.            Follow-up Information   Follow up with [facility] In 1 week.   Contact information:   [address] [id number] [place] [address] [phone]         [facility] 05/07/2013, 8:44 PM

## 2013-05-07 NOTE — MAU Note (Signed)
PT SAYS SHE WAS ON WED - LABS, U/S,     TOLD TO COME TODAY.   NOW HAS  MORE CRAMPS THAN ON WED - PAIN- 10-  TOOK 500 MG TYLENOL AT  12NOON.  ALSO HAS VAG BLEEDING - ON WED WAS PINK,  TODAY RED LIKE CYCLE.   IN TRIAGE   - ON PAD - A SMALL AMT LIGHT RED.

## 2013-05-08 ENCOUNTER — Encounter (HOSPITAL_COMMUNITY): Payer: Self-pay | Admitting: *Deleted

## 2013-05-08 ENCOUNTER — Inpatient Hospital Stay (HOSPITAL_COMMUNITY)
Admission: AD | Admit: 2013-05-08 | Discharge: 2013-05-08 | Disposition: A | Discharge status: Home / Self Care | Payer: Medicaid Other | Source: Ambulatory Visit | Attending: Obstetrics and Gynecology | Admitting: Obstetrics and Gynecology

## 2013-05-08 DIAGNOSIS — O034 Incomplete spontaneous abortion without complication: Secondary | ICD-10-CM

## 2013-05-08 DIAGNOSIS — R109 Unspecified abdominal pain: Secondary | ICD-10-CM | POA: Insufficient documentation

## 2013-05-08 MED ORDER — KETOROLAC TROMETHAMINE 60 MG/2ML IM SOLN
60.0000 mg | Freq: Once | INTRAMUSCULAR | Status: AC
  Administered 2013-05-08: 60 mg via INTRAMUSCULAR
  Filled 2013-05-08: qty 2

## 2013-05-08 NOTE — MAU Provider Note (Signed)
Attestation of Attending Supervision of Advanced Practitioner (CNM/NP): Evaluation and management procedures were performed by the Advanced Practitioner under my supervision and collaboration.  I have reviewed the Advanced Practitioner's note and chart, and I agree with the management and plan.  Azan Maneri 05/08/2013 4:34 PM

## 2013-05-08 NOTE — Progress Notes (Signed)
Patient states she was unable to pick up Labetalol due to some conflict in insurance. Patient asking if Labetalol is the best medication for her BP now that she is no longer pregnant. Will ask NP.

## 2013-05-08 NOTE — MAU Provider Note (Signed)
History     CSN: [id number]  Arrival date and time: 05/08/13 1044  Seen by provider at 1144     No chief complaint on file.  HPI Suzanne Simpson 37 y.o. [redacted]w[redacted]d Comes to MAU today with severe cramps and bleeding.  Was here yesterday and Quant had dropped from 800 on 05-05-13 to 200 on 05-07-13.  Ultrasound shows nothing seen in the uterus.  Was told yesterday that she was having a miscarriage.  She was not able to get her ibuprofen filled last night.  She did take Midol and was able to sleep but today the cramping is much worse.  Client is tearful.  Has not been taking BP medication and on arrival BP is elevated, but yesterday BP was normal.    OB History   Grav Para Term Preterm Abortions TAB SAB Ect Mult Living   3 2 1       2       Past Medical History  Diagnosis Date  . No pertinent past medical history   . Pregnancy induced hypertension     Past Surgical History  Procedure Laterality Date  . Cesarean section  2003  . Cesarean section  07/09/2012    Procedure: CESAREAN SECTION;  Surgeon: Loney Laurence, MD;  Location: [facility];  Service: Obstetrics;  Laterality: N/A;  . Achilles tendon repair      Family History  Problem Relation Age of Onset  . Diabetes Mother   . Hypertension Mother   . Hyperlipidemia Father   . Hypertension Maternal Aunt   . Diabetes Maternal Aunt     History  Substance Use Topics  . Smoking status: Never Smoker   . Smokeless tobacco: Never Used  . Alcohol Use: No    Allergies: No Known Allergies  Prescriptions prior to admission  Medication Sig Dispense Refill  . acetaminophen (TYLENOL) 325 MG tablet Take 650 mg by mouth every 6 (six) hours as needed for pain.      Marland Kitchen ibuprofen (ADVIL,MOTRIN) 800 MG tablet Take 1 tablet (800 mg total) by mouth every 8 (eight) hours as needed for pain.  30 tablet  0  . labetalol (NORMODYNE) 100 MG tablet Take 1 tablet (100 mg total) by mouth 2 (two) times daily.  60 tablet  0    Review of Systems   Constitutional: Negative for fever.  Gastrointestinal: Positive for abdominal pain. Negative for nausea, vomiting, diarrhea and constipation.  Genitourinary:       No vaginal discharge. Small amount vaginal bleeding. No dysuria.   Physical Exam   Blood pressure 156/90, pulse 65, temperature 98.9 F (37.2 C), resp. rate 20, last menstrual period 03/11/2013, SpO2 100.00%. Serial blood pressures done and after pain was managed, blood pressures are near normal. 143/91   Physical Exam  Nursing note and vitals reviewed. Constitutional: She is oriented to person, place, and time. She appears well-developed and well-nourished. She appears distressed.  tearful  HENT:  Head: Normocephalic.  Eyes: EOM are normal.  Neck: Neck supple.  GI: Soft. There is tenderness. There is no rebound and no guarding.  Musculoskeletal: Normal range of motion.  Neurological: She is alert and oriented to person, place, and time.  Skin: Skin is warm and dry.  Psychiatric: She has a normal mood and affect.    MAU Course  Procedures  MDM Given Toradol 60 mg IM for pain and pain was relieved.  Assessment and Plan  Incomplete miscarriage  Plan Continue ibuprofen that you have prescribed. Expect  the clinic to call to schedule an appointment for followup Since this is not a viable pregnancy, continue the medication you were taking previously for your blood pressure. Expect to have bleeding more than a period with the miscarriage.  BURLESON,TERRI 05/08/2013, 11:00 AM

## 2013-05-08 NOTE — MAU Note (Signed)
Pt states began miscarrying Wednesday, is having heavy cramping at present. Changing pad or tampon q2 hours for past two days

## 2013-05-20 ENCOUNTER — Encounter: Payer: Medicaid Other | Admitting: Obstetrics and Gynecology

## 2013-05-23 NOTE — MAU Provider Note (Signed)
Attestation of Attending Supervision of Advanced Practitioner: Evaluation and management procedures were performed by the PA/NP/CNM/OB Fellow under my supervision/collaboration. Chart reviewed and agree with management and plan.  Tamura Lasky V 05/23/2013 3:53 PM

## 2013-09-30 ENCOUNTER — Emergency Department (INDEPENDENT_AMBULATORY_CARE_PROVIDER_SITE_OTHER)
Admission: EM | Admit: 2013-09-30 | Discharge: 2013-09-30 | Disposition: A | Discharge status: Home / Self Care | Payer: Medicaid Other | Source: Home / Self Care | Attending: Family Medicine | Admitting: Family Medicine

## 2013-09-30 ENCOUNTER — Encounter (HOSPITAL_COMMUNITY): Payer: Self-pay | Admitting: Emergency Medicine

## 2013-09-30 DIAGNOSIS — S60219A Contusion of unspecified wrist, initial encounter: Secondary | ICD-10-CM

## 2013-09-30 DIAGNOSIS — S1093XA Contusion of unspecified part of neck, initial encounter: Secondary | ICD-10-CM

## 2013-09-30 DIAGNOSIS — S60211A Contusion of right wrist, initial encounter: Secondary | ICD-10-CM

## 2013-09-30 DIAGNOSIS — S0003XA Contusion of scalp, initial encounter: Secondary | ICD-10-CM

## 2013-09-30 DIAGNOSIS — R519 Headache, unspecified: Secondary | ICD-10-CM

## 2013-09-30 DIAGNOSIS — S0083XA Contusion of other part of head, initial encounter: Secondary | ICD-10-CM

## 2013-09-30 DIAGNOSIS — R51 Headache: Secondary | ICD-10-CM

## 2013-09-30 NOTE — Discharge Instructions (Signed)
Contusion A contusion is a deep bruise. Contusions are the result of an injury that caused bleeding under the skin. The contusion may turn blue, purple, or yellow. Minor injuries will give you a painless contusion, but more severe contusions may stay painful and swollen for a few weeks.  CAUSES  A contusion is usually caused by a blow, trauma, or direct force to an area of the body. SYMPTOMS   Swelling and redness of the injured area.  Bruising of the injured area.  Tenderness and soreness of the injured area.  Pain. DIAGNOSIS  The diagnosis can be made by taking a history and physical exam. An X-ray, CT scan, or MRI may be needed to determine if there were any associated injuries, such as fractures. TREATMENT  Specific treatment will depend on what area of the body was injured. In general, the best treatment for a contusion is resting, icing, elevating, and applying cold compresses to the injured area. Over-the-counter medicines may also be recommended for pain control. Ask your caregiver what the best treatment is for your contusion. HOME CARE INSTRUCTIONS   Put ice on the injured area.  Put ice in a plastic bag.  Place a towel between your skin and the bag.  Leave the ice on for 15-20 minutes, 03-04 times a day.  Only take over-the-counter or prescription medicines for pain, discomfort, or fever as directed by your caregiver. Your caregiver may recommend avoiding anti-inflammatory medicines (aspirin, ibuprofen, and naproxen) for 48 hours because these medicines may increase bruising.  Rest the injured area.  If possible, elevate the injured area to reduce swelling. SEEK IMMEDIATE MEDICAL CARE IF:   You have increased bruising or swelling.  You have pain that is getting worse.  Your swelling or pain is not relieved with medicines. MAKE SURE YOU:   Understand these instructions.  Will watch your condition.  Will get help right away if you are not doing well or get  worse. Document Released: 06/05/2005 Document Revised: 11/18/2011 Document Reviewed: 07/01/2011 [facility] Patient Information 2014 [place], [place]. Hypertension As your heart beats, it forces blood through your arteries. This force is your blood pressure. If the pressure is too high, it is called hypertension (HTN) or high blood pressure. HTN is dangerous because you may have it and not know it. High blood pressure may mean that your heart has to work harder to pump blood. Your arteries may be narrow or stiff. The extra work puts you at risk for heart disease, stroke, and other problems.  Blood pressure consists of two numbers, a higher number over a lower, 110/72, for example. It is stated as "110 over 72." The ideal is below 120 for the top number (systolic) and under 80 for the bottom (diastolic). Write down your blood pressure today. You should pay close attention to your blood pressure if you have certain conditions such as:  Heart failure.  Prior heart attack.  Diabetes  Chronic kidney disease.  Prior stroke.  Multiple risk factors for heart disease. To see if you have HTN, your blood pressure should be measured while you are seated with your arm held at the level of the heart. It should be measured at least twice. A one-time elevated blood pressure reading (especially in the Emergency Department) does not mean that you need treatment. There may be conditions in which the blood pressure is different between your right and left arms. It is important to see your caregiver soon for a recheck. Most people have essential hypertension  which means that there is not a specific cause. This type of high blood pressure may be lowered by changing lifestyle factors such as:  Stress.  Smoking.  Lack of exercise.  Excessive weight.  Drug/tobacco/alcohol use.  Eating less salt. Most people do not have symptoms from high blood pressure until it has caused damage to the body. Effective treatment  can often prevent, delay or reduce that damage. TREATMENT  When a cause has been identified, treatment for high blood pressure is directed at the cause. There are a large number of medications to treat HTN. These fall into several categories, and your caregiver will help you select the medicines that are best for you. Medications may have side effects. You should review side effects with your caregiver. If your blood pressure stays high after you have made lifestyle changes or started on medicines,   Your medication(s) may need to be changed.  Other problems may need to be addressed.  Be certain you understand your prescriptions, and know how and when to take your medicine.  Be sure to follow up with your caregiver within the time frame advised (usually within two weeks) to have your blood pressure rechecked and to review your medications.  If you are taking more than one medicine to lower your blood pressure, make sure you know how and at what times they should be taken. Taking two medicines at the same time can result in blood pressure that is too low. SEEK IMMEDIATE MEDICAL CARE IF:  You develop a severe headache, blurred or changing vision, or confusion.  You have unusual weakness or numbness, or a faint feeling.  You have severe chest or abdominal pain, vomiting, or breathing problems. MAKE SURE YOU:   Understand these instructions.  Will watch your condition.  Will get help right away if you are not doing well or get worse. Document Released: 08/26/2005 Document Revised: 11/18/2011 Document Reviewed: 04/15/2008 [facility] Patient Information 2014 [place], [place].
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
## 2013-10-01 NOTE — ED Provider Notes (Signed)
Medical screening examination/treatment/procedure(s) were performed by resident physician or non-physician practitioner and as supervising physician I was immediately available for consultation/collaboration.   Nekeya Briski DOUGLAS MD.   Robynne Roat D Elisabella Hacker, MD 10/01/13 1312 

## 2014-02-14 ENCOUNTER — Encounter (HOSPITAL_COMMUNITY): Payer: Self-pay | Admitting: Emergency Medicine

## 2014-02-14 ENCOUNTER — Emergency Department (HOSPITAL_COMMUNITY): Payer: No Typology Code available for payment source

## 2014-02-14 ENCOUNTER — Emergency Department (HOSPITAL_COMMUNITY)
Admission: EM | Admit: 2014-02-14 | Discharge: 2014-02-14 | Disposition: A | Discharge status: Home / Self Care | Payer: No Typology Code available for payment source | Attending: Emergency Medicine | Admitting: Emergency Medicine

## 2014-02-14 DIAGNOSIS — Z8679 Personal history of other diseases of the circulatory system: Secondary | ICD-10-CM | POA: Insufficient documentation

## 2014-02-14 DIAGNOSIS — Z3202 Encounter for pregnancy test, result negative: Secondary | ICD-10-CM | POA: Insufficient documentation

## 2014-02-14 DIAGNOSIS — S99919A Unspecified injury of unspecified ankle, initial encounter: Secondary | ICD-10-CM

## 2014-02-14 DIAGNOSIS — S99929A Unspecified injury of unspecified foot, initial encounter: Secondary | ICD-10-CM

## 2014-02-14 DIAGNOSIS — S8990XA Unspecified injury of unspecified lower leg, initial encounter: Secondary | ICD-10-CM | POA: Insufficient documentation

## 2014-02-14 DIAGNOSIS — Y9389 Activity, other specified: Secondary | ICD-10-CM | POA: Insufficient documentation

## 2014-02-14 DIAGNOSIS — M25561 Pain in right knee: Secondary | ICD-10-CM

## 2014-02-14 DIAGNOSIS — Y92410 Unspecified street and highway as the place of occurrence of the external cause: Secondary | ICD-10-CM | POA: Insufficient documentation

## 2014-02-14 DIAGNOSIS — S139XXA Sprain of joints and ligaments of unspecified parts of neck, initial encounter: Secondary | ICD-10-CM | POA: Insufficient documentation

## 2014-02-14 DIAGNOSIS — S161XXA Strain of muscle, fascia and tendon at neck level, initial encounter: Secondary | ICD-10-CM

## 2014-02-14 LAB — POC URINE PREG, ED: Preg Test, Ur: NEGATIVE

## 2014-02-14 MED ORDER — IBUPROFEN 400 MG PO TABS: 400.0000 mg | ORAL_TABLET | Freq: Four times a day (QID) | ORAL | Status: DC | PRN
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
## 2014-02-14 NOTE — ED Notes (Signed)
PA at bedside.

## 2014-02-14 NOTE — Discharge Instructions (Signed)
Please call your doctor for a followup appointment within 24-48 hours. When you talk to your doctor please let them know that you were seen in the emergency department and have them acquire all of your records so that they can discuss the findings with you and formulate a treatment plan to fully care for your new and ongoing problems. Please call and set up an appointment with her primary care provider to be seen and reassessed this week Please call and set up an appointment with orthopedics Please rest and stay hydrated Please avoid any physical or strenuous activity Please apply icy hot ointment and massage to aid in muscular relief Please perform warm soaks Please continue to monitor symptoms closely if symptoms are to worsen or change (fever greater than 1, chills, chest pain, short of breath, difficulty breathing, numbness, tingling, headaches, dizziness, blurred vision, sudden loss of vision, weakness, fall, injury, worsening or changes to pain pattern) please report back to the ED immediately   Cervical Strain and Sprain (Whiplash) with Rehab Cervical strain and sprains are injuries that commonly occur with "whiplash" injuries. Whiplash occurs when the neck is forcefully whipped backward or forward, such as during a motor vehicle accident. The muscles, ligaments, tendons, discs and nerves of the neck are susceptible to injury when this occurs. SYMPTOMS   Pain or stiffness in the front and/or back of neck  Symptoms may present immediately or up to 24 hours after injury.  Dizziness, headache, nausea and vomiting.  Muscle spasm with soreness and stiffness in the neck.  Tenderness and swelling at the injury site. CAUSES  Whiplash injuries often occur during contact sports or motor vehicle accidents.  RISK INCREASES WITH:  Osteoarthritis of the spine.  Situations that make head or neck accidents or trauma more likely.  High-risk sports (football, rugby, wrestling, hockey, auto  racing, gymnastics, diving, contact karate or boxing).  Poor strength and flexibility of the neck.  Previous neck injury.  Poor tackling technique.  Improperly fitted or padded equipment. PREVENTION  Learn and use proper technique (avoid tackling with the head, spearing and head-butting; use proper falling techniques to avoid landing on the head).  Warm up and stretch properly before activity.  Maintain physical fitness:  Strength, flexibility and endurance.  Cardiovascular fitness.  Wear properly fitted and padded protective equipment, such as padded soft collars, for participation in contact sports. PROGNOSIS  Recovery for cervical strain and sprain injuries is dependent on the extent of the injury. These injuries are usually curable in 1 week to 3 months with appropriate treatment.  RELATED COMPLICATIONS   Temporary numbness and weakness may occur if the nerve roots are damaged, and this may persist until the nerve has completely healed.  Chronic pain due to frequent recurrence of symptoms.  Prolonged healing, especially if activity is resumed too soon (before complete recovery). TREATMENT  Treatment initially involves the use of ice and medication to help reduce pain and inflammation. It is also important to perform strengthening and stretching exercises and modify activities that worsen symptoms so the injury does not get worse. These exercises may be performed at home or with a therapist. For patients who experience severe symptoms, a soft padded collar may be recommended to be worn around the neck.  Improving your posture may help reduce symptoms. Posture improvement includes pulling your chin and abdomen in while sitting or standing. If you are sitting, sit in a firm chair with your buttocks against the back of the chair. While sleeping, try replacing  your pillow with a small towel rolled to 2 inches in diameter, or use a cervical pillow or soft cervical collar. Poor sleeping  positions delay healing.  For patients with nerve root damage, which causes numbness or weakness, the use of a cervical traction apparatus may be recommended. Surgery is rarely necessary for these injuries. However, cervical strain and sprains that are present at birth (congenital) may require surgery. MEDICATION   If pain medication is necessary, nonsteroidal anti-inflammatory medications, such as aspirin and ibuprofen, or other minor pain relievers, such as acetaminophen, are often recommended.  Do not take pain medication for 7 days before surgery.  Prescription pain relievers may be given if deemed necessary by your caregiver. Use only as directed and only as much as you need. HEAT AND COLD:   Cold treatment (icing) relieves pain and reduces inflammation. Cold treatment should be applied for 10 to 15 minutes every 2 to 3 hours for inflammation and pain and immediately after any activity that aggravates your symptoms. Use ice packs or an ice massage.  Heat treatment may be used prior to performing the stretching and strengthening activities prescribed by your caregiver, physical therapist, or athletic trainer. Use a heat pack or a warm soak. SEEK MEDICAL CARE IF:   Symptoms get worse or do not improve in 2 weeks despite treatment.  New, unexplained symptoms develop (drugs used in treatment may produce side effects). EXERCISES RANGE OF MOTION (ROM) AND STRETCHING EXERCISES - Cervical Strain and Sprain These exercises may help you when beginning to rehabilitate your injury. In order to successfully resolve your symptoms, you must improve your posture. These exercises are designed to help reduce the forward-head and rounded-shoulder posture which contributes to this condition. Your symptoms may resolve with or without further involvement from your physician, physical therapist or athletic trainer. While completing these exercises, remember:   Restoring tissue flexibility helps normal motion to  return to the joints. This allows healthier, less painful movement and activity.  An effective stretch should be held for at least 20 seconds, although you may need to begin with shorter hold times for comfort.  A stretch should never be painful. You should only feel a gentle lengthening or release in the stretched tissue. STRETCH- Axial Extensors  Lie on your back on the floor. You may bend your knees for comfort. Place a rolled up hand towel or dish towel, about 2 inches in diameter, under the part of your head that makes contact with the floor.  Gently tuck your chin, as if trying to make a "double chin," until you feel a gentle stretch at the base of your head.  Hold __________ seconds. Repeat __________ times. Complete this exercise __________ times per day.  STRETECH - Axial Extension   Stand or sit on a firm surface. Assume a good posture: chest up, shoulders drawn back, abdominal muscles slightly tense, knees unlocked (if standing) and feet hip width apart.  Slowly retract your chin so your head slides back and your chin slightly lowers.Continue to look straight ahead.  You should feel a gentle stretch in the back of your head. Be certain not to feel an aggressive stretch since this can cause headaches later.  Hold for __________ seconds. Repeat __________ times. Complete this exercise __________ times per day. STRETCH  Cervical Side Bend   Stand or sit on a firm surface. Assume a good posture: chest up, shoulders drawn back, abdominal muscles slightly tense, knees unlocked (if standing) and feet hip width apart.  Without letting your nose or shoulders move, slowly tip your right / left ear to your shoulder until your feel a gentle stretch in the muscles on the opposite side of your neck.  Hold __________ seconds. Repeat __________ times. Complete this exercise __________ times per day. STRETCH  Cervical Rotators   Stand or sit on a firm surface. Assume a good posture: chest  up, shoulders drawn back, abdominal muscles slightly tense, knees unlocked (if standing) and feet hip width apart.  Keeping your eyes level with the ground, slowly turn your head until you feel a gentle stretch along the back and opposite side of your neck.  Hold __________ seconds. Repeat __________ times. Complete this exercise __________ times per day. RANGE OF MOTION - Neck Circles   Stand or sit on a firm surface. Assume a good posture: chest up, shoulders drawn back, abdominal muscles slightly tense, knees unlocked (if standing) and feet hip width apart.  Gently roll your head down and around from the back of one shoulder to the back of the other. The motion should never be forced or painful.  Repeat the motion 10-20 times, or until you feel the neck muscles relax and loosen. Repeat __________ times. Complete the exercise __________ times per day. STRENGTHENING EXERCISES - Cervical Strain and Sprain These exercises may help you when beginning to rehabilitate your injury. They may resolve your symptoms with or without further involvement from your physician, physical therapist or athletic trainer. While completing these exercises, remember:   Muscles can gain both the endurance and the strength needed for everyday activities through controlled exercises.  Complete these exercises as instructed by your physician, physical therapist or athletic trainer. Progress the resistance and repetitions only as guided.  You may experience muscle soreness or fatigue, but the pain or discomfort you are trying to eliminate should never worsen during these exercises. If this pain does worsen, stop and make certain you are following the directions exactly. If the pain is still present after adjustments, discontinue the exercise until you can discuss the trouble with your clinician. STRENGTH Cervical Flexors, Isometric  Face a wall, standing about 6 inches away. Place a small pillow, a ball about 6-8  inches in diameter, or a folded towel between your forehead and the wall.  Slightly tuck your chin and gently push your forehead into the soft object. Push only with mild to moderate intensity, building up tension gradually. Keep your jaw and forehead relaxed.  Hold 10 to 20 seconds. Keep your breathing relaxed.  Release the tension slowly. Relax your neck muscles completely before you start the next repetition. Repeat __________ times. Complete this exercise __________ times per day. STRENGTH- Cervical Lateral Flexors, Isometric   Stand about 6 inches away from a wall. Place a small pillow, a ball about 6-8 inches in diameter, or a folded towel between the side of your head and the wall.  Slightly tuck your chin and gently tilt your head into the soft object. Push only with mild to moderate intensity, building up tension gradually. Keep your jaw and forehead relaxed.  Hold 10 to 20 seconds. Keep your breathing relaxed.  Release the tension slowly. Relax your neck muscles completely before you start the next repetition. Repeat __________ times. Complete this exercise __________ times per day. STRENGTH  Cervical Extensors, Isometric   Stand about 6 inches away from a wall. Place a small pillow, a ball about 6-8 inches in diameter, or a folded towel between the back of your head and  the wall.  Slightly tuck your chin and gently tilt your head back into the soft object. Push only with mild to moderate intensity, building up tension gradually. Keep your jaw and forehead relaxed.  Hold 10 to 20 seconds. Keep your breathing relaxed.  Release the tension slowly. Relax your neck muscles completely before you start the next repetition. Repeat __________ times. Complete this exercise __________ times per day. POSTURE AND BODY MECHANICS CONSIDERATIONS - Cervical Strain and Sprain Keeping correct posture when sitting, standing or completing your activities will reduce the stress put on different  body tissues, allowing injured tissues a chance to heal and limiting painful experiences. The following are general guidelines for improved posture. Your physician or physical therapist will provide you with any instructions specific to your needs. While reading these guidelines, remember:  The exercises prescribed by your provider will help you have the flexibility and strength to maintain correct postures.  The correct posture provides the optimal environment for your joints to work. All of your joints have less wear and tear when properly supported by a spine with good posture. This means you will experience a healthier, less painful body.  Correct posture must be practiced with all of your activities, especially prolonged sitting and standing. Correct posture is as important when doing repetitive low-stress activities (typing) as it is when doing a single heavy-load activity (lifting). PROLONGED STANDING WHILE SLIGHTLY LEANING FORWARD When completing a task that requires you to lean forward while standing in one place for a long time, place either foot up on a stationary 2-4 inch high object to help maintain the best posture. When both feet are on the ground, the low back tends to lose its slight inward curve. If this curve flattens (or becomes too large), then the back and your other joints will experience too much stress, fatigue more quickly and can cause pain.  RESTING POSITIONS Consider which positions are most painful for you when choosing a resting position. If you have pain with flexion-based activities (sitting, bending, stooping, squatting), choose a position that allows you to rest in a less flexed posture. You would want to avoid curling into a fetal position on your side. If your pain worsens with extension-based activities (prolonged standing, working overhead), avoid resting in an extended position such as sleeping on your stomach. Most people will find more comfort when they rest with  their spine in a more neutral position, neither too rounded nor too arched. Lying on a non-sagging bed on your side with a pillow between your knees, or on your back with a pillow under your knees will often provide some relief. Keep in mind, being in any one position for a prolonged period of time, no matter how correct your posture, can still lead to stiffness. WALKING Walk with an upright posture. Your ears, shoulders and hips should all line-up. OFFICE WORK When working at a desk, create an environment that supports good, upright posture. Without extra support, muscles fatigue and lead to excessive strain on joints and other tissues. CHAIR:  A chair should be able to slide under your desk when your back makes contact with the back of the chair. This allows you to work closely.  The chair's height should allow your eyes to be level with the upper part of your monitor and your hands to be slightly lower than your elbows.  Body position:  Your feet should make contact with the floor. If this is not possible, use a foot rest.  Keep  your ears over your shoulders. This will reduce stress on your neck and low back. Document Released: 08/26/2005 Document Revised: 12/21/2012 Document Reviewed: 12/08/2008 [facility] Patient Information 2014 [place], [place].   Arthralgia Arthralgia is joint pain. A joint is a place where two bones meet. Joint pain can happen for many reasons. The joint can be bruised, stiff, infected, or weak from aging. Pain usually goes away after resting and taking medicine for soreness.  HOME CARE  Rest the joint as told by your doctor.  Keep the sore joint raised (elevated) for the first 24 hours.  Put ice on the joint area.  Put ice in a plastic bag.  Place a towel between your skin and the bag.  Leave the ice on for 15-20 minutes, 03-04 times a day.  Wear your splint, casting, elastic bandage, or sling as told by your doctor.  Only take medicine as told by your  doctor. Do not take aspirin.  Use crutches as told by your doctor. Do not put weight on the joint until told to by your doctor. GET HELP RIGHT AWAY IF:   You have bruising, puffiness (swelling), or more pain.  Your fingers or toes turn blue or start to lose feeling (numb).  Your medicine does not lessen the pain.  Your pain becomes severe.  You have a temperature by mouth above 102 F (38.9 C), not controlled by medicine.  You cannot move or use the joint. MAKE SURE YOU:   Understand these instructions.  Will watch your condition.  Will get help right away if you are not doing well or get worse. Document Released: 08/14/2009 Document Revised: 11/18/2011 Document Reviewed: 08/14/2009 [facility] Patient Information 2014 [place], [place].

## 2014-02-17 NOTE — ED Provider Notes (Signed)
Medical screening examination/treatment/procedure(s) were performed by non-physician practitioner and as supervising physician I was immediately available for consultation/collaboration.   EKG Interpretation None        Vanetta Mulders, MD 02/17/14 1119

## 2014-07-11 ENCOUNTER — Encounter (HOSPITAL_COMMUNITY): Payer: Self-pay | Admitting: Emergency Medicine

## 2018-04-05 ENCOUNTER — Emergency Department (HOSPITAL_COMMUNITY)
Admission: EM | Admit: 2018-04-05 | Discharge: 2018-04-05 | Disposition: A | Discharge status: Home / Self Care | Payer: Self-pay | Attending: Emergency Medicine | Admitting: Emergency Medicine

## 2018-04-05 ENCOUNTER — Other Ambulatory Visit: Payer: Self-pay

## 2018-04-05 ENCOUNTER — Encounter (HOSPITAL_COMMUNITY): Payer: Self-pay | Admitting: Emergency Medicine

## 2018-04-05 DIAGNOSIS — I10 Essential (primary) hypertension: Secondary | ICD-10-CM | POA: Insufficient documentation

## 2018-04-05 DIAGNOSIS — Y939 Activity, unspecified: Secondary | ICD-10-CM | POA: Insufficient documentation

## 2018-04-05 DIAGNOSIS — Y999 Unspecified external cause status: Secondary | ICD-10-CM | POA: Insufficient documentation

## 2018-04-05 DIAGNOSIS — X58XXXA Exposure to other specified factors, initial encounter: Secondary | ICD-10-CM | POA: Insufficient documentation

## 2018-04-05 DIAGNOSIS — S81801A Unspecified open wound, right lower leg, initial encounter: Secondary | ICD-10-CM | POA: Insufficient documentation

## 2018-04-05 DIAGNOSIS — Z4802 Encounter for removal of sutures: Secondary | ICD-10-CM | POA: Insufficient documentation

## 2018-04-05 DIAGNOSIS — Y929 Unspecified place or not applicable: Secondary | ICD-10-CM | POA: Insufficient documentation

## 2018-04-05 DIAGNOSIS — Z79899 Other long term (current) drug therapy: Secondary | ICD-10-CM | POA: Insufficient documentation

## 2018-04-05 MED ORDER — DOXYCYCLINE HYCLATE 100 MG PO TABS
100.0000 mg | ORAL_TABLET | Freq: Once | ORAL | Status: AC
  Administered 2018-04-05: 100 mg via ORAL
  Filled 2018-04-05: qty 1

## 2018-04-05 MED ORDER — DOXYCYCLINE HYCLATE 100 MG PO CAPS: 100.0000 mg | ORAL_CAPSULE | Freq: Two times a day (BID) | ORAL | 0 refills | Status: AC

## 2018-04-05 NOTE — ED Provider Notes (Addendum)
[facility] EMERGENCY DEPARTMENT Provider Note   CSN: [id number] Arrival date & time: 04/05/18  2207     History   Chief Complaint Chief Complaint  Patient presents with  . Suture / Staple Removal    HPI Suzanne Simpson is a 42 y.o. female with history of hypertension presents for evaluation of acute onset, intermittent right lower extremity pain secondary to wound for 1 week.  She states that 2 weeks ago she was in [place] and sustained a laceration to the right shin where and her foot was stuck in a hole.  She went to the emergency department, radiographs were negative for acute fracture dislocation, and she underwent laceration repair with 12 sutures.  She states that she was not sure if the sutures were dissolvable so she did not come to the ED for removal.  She states for the past week she has had intermittent sharp aching pains to the area.  She denies any abnormal drainage, fevers, chills, numbness, weakness, nausea, vomiting.  She has been applying Neosporin and changing the bandage daily.  Tetanus was updated while in the ED in [place].  The history is provided by the patient.    Past Medical History:  Diagnosis Date  . No pertinent past medical history   . Pregnancy induced hypertension     Patient Active Problem List   Diagnosis Date Noted  . Unspecified essential hypertension 02/15/2013    Past Surgical History:  Procedure Laterality Date  . ACHILLES TENDON REPAIR    . CESAREAN SECTION  2003  . CESAREAN SECTION  07/09/2012   Procedure: CESAREAN SECTION;  Surgeon: Loney Laurence, MD;  Location: [facility];  Service: Obstetrics;  Laterality: N/A;     OB History    Gravida  3   Para  2   Term  1   Preterm      AB      Living  2     SAB      TAB      Ectopic      Multiple      Live Births  2            Home Medications    Prior to Admission medications   Medication Sig Start Date End Date Taking?  Authorizing Provider  Aspirin-Acetaminophen-Caffeine (GOODY HEADACHE PO) Take 1 tablet by mouth daily as needed. For headache    [provider]  doxycycline (VIBRAMYCIN) 100 MG capsule Take 1 capsule (100 mg total) by mouth 2 (two) times daily for 7 days. 04/05/18 04/12/18  Michela Pitcher A, PA-C  ibuprofen (ADVIL,MOTRIN) 400 MG tablet Take 1 tablet (400 mg total) by mouth every 6 (six) hours as needed. 02/14/14   Raymon Mutton, PA-C    Family History Family History  Problem Relation Age of Onset  . Diabetes Mother   . Hypertension Mother   . Hyperlipidemia Father   . Hypertension Maternal Aunt   . Diabetes Maternal Aunt     Social History Social History   Tobacco Use  . Smoking status: Never Smoker  . Smokeless tobacco: Never Used  Substance Use Topics  . Alcohol use: No  . Drug use: No     Allergies   Patient has no known allergies.   Review of Systems Review of Systems  Constitutional: Negative for chills, diaphoresis and fever.  Gastrointestinal: Negative for nausea and vomiting.  Skin: Positive for wound.  Neurological: Negative for weakness and numbness.  Physical Exam Updated Vital Signs BP (!) 149/99 (BP Location: Right Arm)   Pulse 90   Temp 98.2 F (36.8 C) (Oral)   Resp 18   LMP 04/05/2018   SpO2 100%   Physical Exam  Constitutional: She appears well-developed and well-nourished. No distress.  HENT:  Head: Normocephalic and atraumatic.  Eyes: Conjunctivae are normal. Right eye exhibits no discharge. Left eye exhibits no discharge.  Neck: No JVD present. No tracheal deviation present.  Cardiovascular: Normal rate and intact distal pulses.  2+ DP/PT pulse bilaterally  Pulmonary/Chest: Effort normal.  Abdominal: She exhibits no distension.  Musculoskeletal: She exhibits no edema.  5/5 strength of BLE major muscle groups  Neurological: She is alert.  Fluent speech, no facial droop, sensation intact to soft touch of bilateral lower  extremities  Skin: Skin is warm and dry. No erythema.  See image below.  Patient has a wound with 12 nonabsorbable sutures in place.  There appears to be granulation tissue and scant purulent fluid inferiorly.  There is induration and swelling proximally.  This is where the patient is maximally tender to palpation.  No underlying crepitus noted.  Psychiatric: She has a normal mood and affect. Her behavior is normal.  Nursing note and vitals reviewed.      ED Treatments / Results  Labs (all labs ordered are listed, but only abnormal results are displayed) Labs Reviewed - No data to display  EKG None  Radiology No results found.  Procedures .Suture Removal Date/Time: 04/06/2018 12:50 AM Performed by: Jeanie SewerFawze, Maisie Hauser A, PA-C Authorized by: Jeanie SewerFawze, Daneli Butkiewicz A, PA-C   Consent:    Consent obtained:  Verbal   Consent given by:  Patient   Risks discussed:  Bleeding, pain and wound separation Location:    Location:  Lower extremity   Lower extremity location:  Leg   Leg location:  R lower leg Procedure details:    Wound appearance:  Indurated, tender, warm and purulent   Number of sutures removed:  12 Post-procedure details:    Post-removal:  Dressing applied   Patient tolerance of procedure:  Tolerated well, no immediate complications   (including critical care time)  Medications Ordered in ED Medications  doxycycline (VIBRA-TABS) tablet 100 mg (100 mg Oral Given 04/05/18 2343)     Initial Impression / Assessment and Plan / ED Course  I have reviewed the triage vital signs and the nursing notes.  Pertinent labs & imaging results that were available during my care of the patient were reviewed by me and considered in my medical decision making (see chart for details).     Patient presents for evaluation of wound to the right lower extremity.  She is afebrile, somewhat hypertensive in the ED with some improvement on reevaluation.  She has a history of hypertension but does not take  any medications for it and does not currently have a PCP.  I did recommend that she recheck her blood pressure on an outpatient basis and follow-up with PCP if it remains elevated.  The patient is neurovascularly intact.  Compartments are soft.  No concern for osteomyelitis or underlying fracture and patient had negative radiographs in [place] per the patient.  No concern for DVT.  Ambulatory without difficulty.  She has 12 simple interrupted sutures in place to the wound.  There is some visible granulation tissue but also purulent material and edema and induration proximally.  Concerning for infection.  Moreover, the wound would likely require revision in the long-term.  I will refer the patient to wound center for further evaluation and management.  She does not appear to be septic at this time and is overall well-appearing. Called pharmacy tech who informed me that the patient was on clindamycin 300mg  twice daily initially.  Will discharge with course of doxycycline.  Discussed wound care.  Discussed strict ED return precautions. Pt verbalized understanding of and agreement with plan and is safe for discharge home at this time.  Discussed case with Dr. Clayborne Dana who agrees with assessment and plan at this time  Final Clinical Impressions(s) / ED Diagnoses   Final diagnoses:  Visit for suture removal  Wound of right lower extremity, initial encounter    ED Discharge Orders        Ordered    doxycycline (VIBRAMYCIN) 100 MG capsule  2 times daily     04/05/18 2339       Jeanie Sewer, PA-C 04/06/18 0052    Marily Memos, MD 04/08/18 1516

## 2018-04-05 NOTE — Discharge Instructions (Signed)
Please take all of your antibiotics until finished!   You may develop abdominal discomfort or diarrhea from the antibiotic.  You may help offset this with probiotics which you can buy or get in yogurt. Do not eat  or take the probiotics until 2 hours after your antibiotic.   You can alternate ibuprofen or Tylenol as needed for pain.  You can apply cool compresses for comfort and to help with swelling.  Please follow-up with the wound clinic as soon as possible for reevaluation of your leg wound.  Return to the emergency department immediately for any concerning signs or symptoms develop such as worsening swelling, redness, streaking up the leg, abnormal drainage, fevers, vomiting, or weakness.   If your blood pressure (BP) was elevated on multiple readings during this visit above 130 for the top number or above 80 for the bottom number, please have this repeated by your primary care provider within one month. You can also check your blood pressure when you are out at a pharmacy or grocery store. Many have machines that will check your blood pressure.  If your blood pressure remains elevated, please follow-up with your PCP.

## 2018-04-05 NOTE — ED Triage Notes (Addendum)
Pt states she had sutures placed in R lower leg 2 weeks ago and they are starting to hurt.  States she thought they would dissolve but not sure if she needed to have them removed.

## 2019-09-09 ENCOUNTER — Encounter: Payer: Self-pay | Admitting: Obstetrics and Gynecology

## 2019-09-09 ENCOUNTER — Other Ambulatory Visit: Payer: Self-pay

## 2019-09-09 ENCOUNTER — Other Ambulatory Visit (HOSPITAL_COMMUNITY)
Admission: RE | Admit: 2019-09-09 | Discharge: 2019-09-09 | Disposition: A | Discharge status: Home / Self Care | Payer: Medicaid Other | Source: Ambulatory Visit | Attending: Obstetrics and Gynecology | Admitting: Obstetrics and Gynecology

## 2019-09-09 ENCOUNTER — Ambulatory Visit (INDEPENDENT_AMBULATORY_CARE_PROVIDER_SITE_OTHER): Payer: Medicaid Other | Admitting: Obstetrics and Gynecology

## 2019-09-09 VITALS — BP 159/111 | HR 63 | Wt 241.0 lb

## 2019-09-09 DIAGNOSIS — Z01411 Encounter for gynecological examination (general) (routine) with abnormal findings: Secondary | ICD-10-CM | POA: Insufficient documentation

## 2019-09-09 DIAGNOSIS — Z3202 Encounter for pregnancy test, result negative: Secondary | ICD-10-CM | POA: Diagnosis not present

## 2019-09-09 DIAGNOSIS — Z01419 Encounter for gynecological examination (general) (routine) without abnormal findings: Secondary | ICD-10-CM

## 2019-09-09 DIAGNOSIS — N912 Amenorrhea, unspecified: Secondary | ICD-10-CM

## 2019-09-09 DIAGNOSIS — Z0000 Encounter for general adult medical examination without abnormal findings: Secondary | ICD-10-CM | POA: Diagnosis not present

## 2019-09-09 LAB — POCT URINE PREGNANCY: Preg Test, Ur: NEGATIVE

## 2019-09-09 MED ORDER — NIFEDIPINE ER OSMOTIC RELEASE 30 MG PO TB24: 30.0000 mg | ORAL_TABLET | Freq: Every day | ORAL | 2 refills | Status: AC

## 2019-09-09 NOTE — Progress Notes (Signed)
Pt is new patient here for annual exam. Pt reports she has not had a cycle since June of this year. Pt reports c/o night sweats and hot flashes for about 6 months. She is currently SA, no contraception. Last pap smear 2-3 years ago per pt, normal. Never had MMG, pt reports two cousins on maternal side had breast cancer.

## 2019-09-09 NOTE — Progress Notes (Signed)
Subjective:     Suzanne Simpson is a 43 y.o. female P2 with BMI 38 and LMP 02/2019 who is here for a comprehensive physical exam. The patient reports no problems. She is sexually active using condoms for contraception. She denies any pelvic pain or abnormal discharge. She reports amenorrhea since June associated with night sweats and hot flashes. She also reports frequent headaches not always relieved with tylenol  Past Medical History:  Diagnosis Date  . No pertinent past medical history   . Preeclampsia, unspecified trimester   . Pregnancy induced hypertension    Past Surgical History:  Procedure Laterality Date  . ACHILLES TENDON REPAIR    . CESAREAN SECTION  2003  . CESAREAN SECTION  07/09/2012   Procedure: CESAREAN SECTION;  Surgeon: Daria Pastures, MD;  Location: [facility];  Service: Obstetrics;  Laterality: N/A;   Family History  Problem Relation Age of Onset  . Diabetes Mother   . Hypertension Mother   . Hyperlipidemia Father   . Hypertension Maternal Aunt   . Diabetes Maternal Aunt     Social History   Socioeconomic History  . Marital status: Single    Spouse name: Not on file  . Number of children: Not on file  . Years of education: Not on file  . Highest education level: Not on file  Occupational History  . Not on file  Tobacco Use  . Smoking status: Never Smoker  . Smokeless tobacco: Never Used  Substance and Sexual Activity  . Alcohol use: Yes  . Drug use: No  . Sexual activity: Yes    Birth control/protection: None  Other Topics Concern  . Not on file  Social History Narrative  . Not on file   Social Determinants of Health   Financial Resource Strain:   . Difficulty of Paying Living Expenses: Not on file  Food Insecurity:   . Worried About Charity fundraiser in the Last Year: Not on file  . Ran Out of Food in the Last Year: Not on file  Transportation Needs:   . Lack of Transportation (Medical): Not on file  . Lack of Transportation (Non-Medical):  Not on file  Physical Activity:   . Days of Exercise per Week: Not on file  . Minutes of Exercise per Session: Not on file  Stress:   . Feeling of Stress : Not on file  Social Connections:   . Frequency of Communication with Friends and Family: Not on file  . Frequency of Social Gatherings with Friends and Family: Not on file  . Attends Religious Services: Not on file  . Active Member of Clubs or Organizations: Not on file  . Attends Archivist Meetings: Not on file  . Marital Status: Not on file  Intimate Partner Violence:   . Fear of Current or Ex-Partner: Not on file  . Emotionally Abused: Not on file  . Physically Abused: Not on file  . Sexually Abused: Not on file   Health Maintenance  Topic Date Due  . PAP SMEAR-Modifier  05/22/1997  . INFLUENZA VACCINE  04/10/2019  . TETANUS/TDAP  07/11/2022  . HIV Screening  Completed       Review of Systems Pertinent items noted in HPI and remainder of comprehensive ROS otherwise negative.   Objective:  Blood pressure (!) 159/111, pulse 63, weight 241 lb (109.3 kg), last menstrual period 03/02/2019, not currently breastfeeding.     GENERAL: Well-developed, well-nourished female in no acute distress.  HEENT: Normocephalic, atraumatic.  Sclerae anicteric.  NECK: Supple. Normal thyroid.  LUNGS: Clear to auscultation bilaterally.  HEART: Regular rate and rhythm. BREASTS: Symmetric in size. No palpable masses or lymphadenopathy, skin changes, or nipple drainage. ABDOMEN: Soft, nontender, nondistended. No organomegaly. PELVIC: Normal external female genitalia. Vagina is pink and rugated.  Normal discharge. Normal appearing cervix. Uterus is normal in size. No adnexal mass or tenderness. EXTREMITIES: No cyanosis, clubbing, or edema, 2+ distal pulses.    Assessment:    Healthy female exam.      Plan:    Pap smear collected Screening mammogram ordered Health maintenance labs ordered Ovarian hormones also ordered to  assess menopausal status. Discussed progesterone challenge test if normal Patient with elevated BP not on medication- Rx procardia provided and patient instructed to follow up with PCP Patient will be contacted with any abnormal results See After Visit Summary for Counseling Recommendations

## 2019-09-18 ENCOUNTER — Emergency Department (HOSPITAL_COMMUNITY): Payer: Medicaid Other

## 2019-09-18 ENCOUNTER — Ambulatory Visit (HOSPITAL_COMMUNITY)
Admission: EM | Admit: 2019-09-18 | Discharge: 2019-09-18 | Disposition: A | Discharge status: Home / Self Care | Payer: Medicaid Other

## 2019-09-18 ENCOUNTER — Other Ambulatory Visit: Payer: Self-pay

## 2019-09-18 ENCOUNTER — Emergency Department (HOSPITAL_COMMUNITY)
Admission: EM | Admit: 2019-09-18 | Discharge: 2019-09-18 | Disposition: A | Discharge status: Home / Self Care | Payer: Medicaid Other | Attending: Emergency Medicine | Admitting: Emergency Medicine

## 2019-09-18 ENCOUNTER — Encounter (HOSPITAL_COMMUNITY): Payer: Self-pay | Admitting: Emergency Medicine

## 2019-09-18 DIAGNOSIS — I10 Essential (primary) hypertension: Secondary | ICD-10-CM | POA: Insufficient documentation

## 2019-09-18 DIAGNOSIS — R0789 Other chest pain: Secondary | ICD-10-CM | POA: Insufficient documentation

## 2019-09-18 DIAGNOSIS — R519 Headache, unspecified: Secondary | ICD-10-CM | POA: Insufficient documentation

## 2019-09-18 LAB — HEMOGLOBIN A1C
Est. average glucose Bld gHb Est-mCnc: 103 mg/dL
Hgb A1c MFr Bld: 5.2 % (ref 4.8–5.6)

## 2019-09-18 LAB — CBC
HCT: 41 % (ref 36.0–46.0)
Hematocrit: 38.1 % (ref 34.0–46.6)
Hemoglobin: 11.8 g/dL (ref 11.1–15.9)
Hemoglobin: 12.3 g/dL (ref 12.0–15.0)
MCH: 27.9 pg (ref 26.6–33.0)
MCH: 28.1 pg (ref 26.0–34.0)
MCHC: 31 g/dL — ABNORMAL LOW (ref 31.5–35.7)
MCHC: 30 g/dL (ref 30.0–36.0)
MCV: 90 fL (ref 79–97)
MCV: 93.6 fL (ref 80.0–100.0)
Platelets: 236 10*3/uL (ref 150–450)
Platelets: 193 10*3/uL (ref 150–400)
RBC: 4.23 x10E6/uL (ref 3.77–5.28)
RBC: 4.38 MIL/uL (ref 3.87–5.11)
RDW: 15.1 % (ref 11.7–15.4)
RDW: 14.7 % (ref 11.5–15.5)
WBC: 4.8 10*3/uL (ref 3.4–10.8)
WBC: 5.3 10*3/uL (ref 4.0–10.5)
nRBC: 0 % (ref 0.0–0.2)

## 2019-09-18 LAB — BASIC METABOLIC PANEL
Anion gap: 11 (ref 5–15)
BUN: 11 mg/dL (ref 6–20)
CO2: 25 mmol/L (ref 22–32)
Calcium: 9.3 mg/dL (ref 8.9–10.3)
Chloride: 103 mmol/L (ref 98–111)
Creatinine, Ser: 1.05 mg/dL — ABNORMAL HIGH (ref 0.44–1.00)
GFR calc Af Amer: >60 mL/min (ref >60)
GFR calc non Af Amer: >60 mL/min (ref >60)
Glucose, Bld: 101 mg/dL — ABNORMAL HIGH (ref 70–99)
Potassium: 3.8 mmol/L (ref 3.5–5.1)
Sodium: 139 mmol/L (ref 135–145)

## 2019-09-18 LAB — COMPREHENSIVE METABOLIC PANEL WITH GFR
ALT: 15 [IU]/L (ref 0–32)
AST: 18 [IU]/L (ref 0–40)
Albumin/Globulin Ratio: 1.4 (ref 1.2–2.2)
Albumin: 4.3 g/dL (ref 3.8–4.8)
Alkaline Phosphatase: 76 [IU]/L (ref 39–117)
BUN/Creatinine Ratio: 15 (ref 9–23)
BUN: 15 mg/dL (ref 6–24)
Bilirubin Total: 0.5 mg/dL (ref 0.0–1.2)
CO2: 25 mmol/L (ref 20–29)
Calcium: 9.4 mg/dL (ref 8.7–10.2)
Chloride: 102 mmol/L (ref 96–106)
Creatinine, Ser: 0.98 mg/dL (ref 0.57–1.00)
GFR calc Af Amer: 82 mL/min/{1.73_m2}
GFR calc non Af Amer: 71 mL/min/{1.73_m2}
Globulin, Total: 3.1 g/dL (ref 1.5–4.5)
Glucose: 94 mg/dL (ref 65–99)
Potassium: 3.9 mmol/L (ref 3.5–5.2)
Sodium: 137 mmol/L (ref 134–144)
Total Protein: 7.4 g/dL (ref 6.0–8.5)

## 2019-09-18 LAB — LIPID PANEL
Chol/HDL Ratio: 6.6 ratio — ABNORMAL HIGH (ref 0.0–4.4)
Cholesterol, Total: 263 mg/dL — ABNORMAL HIGH (ref 100–199)
HDL: 40 mg/dL
LDL Chol Calc (NIH): 175 mg/dL — ABNORMAL HIGH (ref 0–99)
Triglycerides: 251 mg/dL — ABNORMAL HIGH (ref 0–149)
VLDL Cholesterol Cal: 48 mg/dL — ABNORMAL HIGH (ref 5–40)

## 2019-09-18 LAB — FOLLICLE STIMULATING HORMONE: FSH: 41.5 m[IU]/mL

## 2019-09-18 LAB — TESTT+TESTF+SHBG
Sex Hormone Binding: 33.5 nmol/L (ref 24.6–122.0)
Testosterone, Free: 2.7 pg/mL (ref 0.0–4.2)
Testosterone, Total, LC/MS: 19.1 ng/dL

## 2019-09-18 LAB — TROPONIN I (HIGH SENSITIVITY)
Troponin I (High Sensitivity): 3 ng/L (ref <18)
Troponin I (High Sensitivity): 3 ng/L (ref <18)

## 2019-09-18 LAB — LUTEINIZING HORMONE: LH: 30.2 m[IU]/mL

## 2019-09-18 LAB — I-STAT BETA HCG BLOOD, ED (MC, WL, AP ONLY): I-stat hCG, quantitative: <5 m[IU]/mL (ref <5)

## 2019-09-18 LAB — TSH: TSH: 0.976 u[IU]/mL (ref 0.450–4.500)

## 2019-09-18 LAB — PROLACTIN: Prolactin: 8 ng/mL (ref 4.8–23.3)

## 2019-09-18 MED ORDER — SODIUM CHLORIDE 0.9% FLUSH: 3.0000 mL | Freq: Once | INTRAVENOUS | Status: DC

## 2019-09-18 MED ORDER — ONDANSETRON HCL 4 MG/2ML IJ SOLN
4.0000 mg | Freq: Once | INTRAMUSCULAR | Status: AC
  Administered 2019-09-18: 4 mg via INTRAVENOUS
  Filled 2019-09-18: qty 2

## 2019-09-18 MED ORDER — DIPHENHYDRAMINE HCL 50 MG/ML IJ SOLN
25.0000 mg | Freq: Once | INTRAMUSCULAR | Status: AC
  Administered 2019-09-18: 25 mg via INTRAVENOUS
  Filled 2019-09-18: qty 1

## 2019-09-18 MED ORDER — METOCLOPRAMIDE HCL 5 MG/ML IJ SOLN
10.0000 mg | Freq: Once | INTRAMUSCULAR | Status: AC
  Administered 2019-09-18: 10 mg via INTRAVENOUS
  Filled 2019-09-18: qty 2

## 2019-09-18 MED ORDER — SODIUM CHLORIDE 0.9 % IV BOLUS
1000.0000 mL | Freq: Once | INTRAVENOUS | Status: AC
  Administered 2019-09-18: 1000 mL via INTRAVENOUS

## 2019-09-18 MED ORDER — IOHEXOL 350 MG/ML SOLN
75.0000 mL | Freq: Once | INTRAVENOUS | Status: AC | PRN
  Administered 2019-09-18: 21:00:00 75 mL via INTRAVENOUS

## 2019-09-18 NOTE — ED Triage Notes (Addendum)
Pt sent from [facility].  C/o "worst HA" of her her life since noon today.  Pt covering eyes with cloth.  States her headache is causing her to have aching in the L side of chest.  No arm drift or weakness noted.  Pt only reports pain.  Denies any other symptoms.

## 2019-09-18 NOTE — ED Triage Notes (Signed)
Pt in wheelchair, holding cloth over her face, crying.  C/O having "worst HA" she's ever had.  Recently started nifedipine this past week upon seeing PCP; states has had a HA daily since then, but today HA is "worst it's ever been before".  Denies n/v. Discussed with A. Mortenson, MD - instructed pt to go to ED; explained when someone states they are having worst HA of their life, it warrants an ED visit.  Pt and family member verbalized understanding.

## 2019-09-18 NOTE — ED Provider Notes (Signed)
[facility] EMERGENCY DEPARTMENT Provider Note   CSN: [id number] Arrival date & time: 09/18/19  1728     History Chief Complaint  Patient presents with  . Headache  . Chest Pain    Suzanne Simpson is a 43 y.o. female who presents for evaluation of headache that began about 12 PM this afternoon.  Patient reports she has had intermittent headaches for over the last year.  She has never had a neurological evaluation.  She attributes it to vision/needing new glasses or blood pressure.  She reports that she was recently started on new blood pressure medication a few weeks ago.  She states she has continued to have headaches.  Today at about 12 PM, she was at her aunt's house and was not doing anything strenuous when she started to having a mild headache.  She denies any thunderclap headache.  She states that the headache continued to progress.  She took blood pressure medication as well as Tylenol which did not help the headache.  She states that headache was sharp in nature and was behind her eyes and into her left temple.  She states that this does feel similar to previous headaches that she has had.  She states that she had associated photophobia.  No vision changes, nausea/vomiting.  She states that her headache has improved and is now currently 4/10.  She does report that when the headache was at its worst, she felt like the pain was radiating to her left side of her chest.  States that now chest pain is resolved.  She denies any shortness of breath, numbness/weakness of her arms or legs, diaphoresis, fevers.  She denies any preceding trauma, injury, fall.  The history is provided by the patient.       Past Medical History:  Diagnosis Date  . No pertinent past medical history   . Preeclampsia, unspecified trimester   . Pregnancy induced hypertension     Patient Active Problem List   Diagnosis Date Noted  . Unspecified essential hypertension 02/15/2013    Past Surgical  History:  Procedure Laterality Date  . ACHILLES TENDON REPAIR    . CESAREAN SECTION  2003  . CESAREAN SECTION  07/09/2012   Procedure: CESAREAN SECTION;  Surgeon: Loney Laurence, MD;  Location: [facility];  Service: Obstetrics;  Laterality: N/A;     OB History    Gravida  3   Para  2   Term  1   Preterm      AB      Living  2     SAB      TAB      Ectopic      Multiple      Live Births  2           Family History  Problem Relation Age of Onset  . Diabetes Mother   . Hypertension Mother   . Hyperlipidemia Father   . Hypertension Maternal Aunt   . Diabetes Maternal Aunt     Social History   Tobacco Use  . Smoking status: Never Smoker  . Smokeless tobacco: Never Used  Substance Use Topics  . Alcohol use: Yes  . Drug use: No    Home Medications Prior to Admission medications   Medication Sig Start Date End Date Taking? Authorizing Provider  Aspirin-Acetaminophen-Caffeine (GOODY HEADACHE PO) Take 1 tablet by mouth daily as needed. For headache    [provider]  ibuprofen (ADVIL,MOTRIN) 400 MG tablet Take  1 tablet (400 mg total) by mouth every 6 (six) hours as needed. 02/14/14   Sciacca, Marissa, PA-C  NIFEdipine (PROCARDIA-XL/NIFEDICAL-XL) 30 MG 24 hr tablet Take 1 tablet (30 mg total) by mouth daily. Can increase to twice a day as needed for symptomatic contractions 09/09/19   Constant, Peggy, MD    Allergies    Patient has no known allergies.  Review of Systems   Review of Systems  Constitutional: Negative for fever.  Eyes: Positive for photophobia. Negative for visual disturbance.  Respiratory: Negative for cough and shortness of breath.   Cardiovascular: Positive for chest pain.  Gastrointestinal: Negative for abdominal pain, nausea and vomiting.  Neurological: Positive for headaches. Negative for weakness and numbness.  All other systems reviewed and are negative.   Physical Exam Updated Vital Signs BP (!) 128/93   Pulse (!) 52    Temp 98.7 F (37.1 C) (Oral)   Resp 12   Ht 5\' 7"  (1.702 m)   Wt 100.2 kg   SpO2 99%   BMI 34.61 kg/m   Physical Exam Vitals and nursing note reviewed.  Constitutional:      Appearance: Normal appearance. She is well-developed.  HENT:     Head: Normocephalic and atraumatic.     Comments: No tenderness to palpation of skull. No deformities or crepitus noted. No open wounds, abrasions or lacerations.  Eyes:     General: Lids are normal.     Conjunctiva/sclera: Conjunctivae normal.     Pupils: Pupils are equal, round, and reactive to light.     Comments: PERRL. EOMs intact. No nystagmus. No neglect.   Neck:     Comments: Neck is supple and without rigidity. Cardiovascular:     Rate and Rhythm: Normal rate and regular rhythm.     Pulses: Normal pulses.     Heart sounds: Normal heart sounds. No murmur. No friction rub. No gallop.   Pulmonary:     Effort: Pulmonary effort is normal.     Breath sounds: Normal breath sounds.     Comments: Lungs clear to auscultation bilaterally.  Symmetric chest rise.  No wheezing, rales, rhonchi. Abdominal:     Palpations: Abdomen is soft. Abdomen is not rigid.     Tenderness: There is no abdominal tenderness. There is no guarding.  Musculoskeletal:        General: Normal range of motion.     Cervical back: Full passive range of motion without pain and neck supple.  Skin:    General: Skin is warm and dry.     Capillary Refill: Capillary refill takes less than 2 seconds.  Neurological:     Mental Status: She is alert and oriented to person, place, and time.     Comments: Cranial nerves III-XII intact Follows commands, Moves all extremities  5/5 strength to BUE and BLE  Sensation intact throughout all major nerve distributions. No pronator drift. No gait abnormalities  No slurred speech. No facial droop.   Psychiatric:        Speech: Speech normal.     ED Results / Procedures / Treatments   Labs (all labs ordered are listed, but only  abnormal results are displayed) Labs Reviewed  BASIC METABOLIC PANEL - Abnormal; Notable for the following components:      Result Value   Glucose, Bld 101 (*)    Creatinine, Ser 1.05 (*)    All other components within normal limits  CBC  I-STAT BETA HCG BLOOD, ED ([facility], [facility], [facility] ONLY)  TROPONIN I (HIGH SENSITIVITY)  TROPONIN I (HIGH SENSITIVITY)    EKG None  Radiology CT Angio Head W or Wo Contrast  Result Date: 09/18/2019 CLINICAL DATA:  Headache EXAM: CT ANGIOGRAPHY HEAD TECHNIQUE: Multidetector CT imaging of the head was performed using the standard protocol during bolus administration of intravenous contrast. Multiplanar CT image reconstructions and MIPs were obtained to evaluate the vascular anatomy. CONTRAST:  40mL OMNIPAQUE IOHEXOL 350 MG/ML SOLN COMPARISON:  None. FINDINGS: CT HEAD Brain: There is no acute intracranial hemorrhage, mass-effect, or edema. Gray-white differentiation is preserved. There is no extra-axial fluid collection. Ventricles and sulci are within normal limits in size and configuration. Vascular: No hyperdense vessel or unexpected calcification. Skull: Calvarium is unremarkable. Sinuses/Orbits: No acute finding. Other: None. CTA HEAD Anterior circulation: Intracranial internal carotid arteries are patent. Anterior and middle cerebral arteries are patent. Posterior circulation: Intracranial vertebral arteries, basilar artery, and posterior cerebral arteries are patent. Venous sinuses: As permitted by contrast timing, patent. IMPRESSION: No acute intracranial abnormality.  No aneurysm. Electronically Signed   By: Macy Mis M.D.   On: 09/18/2019 21:17    Procedures Procedures (including critical care time)  Medications Ordered in ED Medications  sodium chloride flush (NS) 0.9 % injection 3 mL (3 mLs Intravenous Not Given 09/18/19 1957)  sodium chloride flush (NS) 0.9 % injection 3 mL (3 mLs Intravenous Not Given 09/18/19 1957)  sodium chloride 0.9 % bolus 1,000 mL  (0 mLs Intravenous Stopped 09/18/19 2313)  metoCLOPramide (REGLAN) injection 10 mg (10 mg Intravenous Given 09/18/19 1952)  diphenhydrAMINE (BENADRYL) injection 25 mg (25 mg Intravenous Given 09/18/19 1954)  ondansetron (ZOFRAN) injection 4 mg (4 mg Intravenous Given 09/18/19 1954)  iohexol (OMNIPAQUE) 350 MG/ML injection 75 mL (75 mLs Intravenous Contrast Given 09/18/19 2102)    ED Course  I have reviewed the triage vital signs and the nursing notes.  Pertinent labs & imaging results that were available during my care of the patient were reviewed by me and considered in my medical decision making (see chart for details).    MDM Rules/Calculators/A&P                      44 y.o. F who presents for evaluation of back that began today at 25 PM.  She states she has had headaches before and this is felt similar.  She states that during the worst of the headache, she felt like it was going to her chest.  Currently does not have any chest pain.  States that headache is improved but still at a 4/10.  On initially arrival, she is afebrile, nontoxic-appearing.  Vital signs are stable.  No neuro deficits.  Low suspicion for hemorrhage, aneurysm but given that she is consistently having headaches, will plan for imaging.  Additionally, will get blood work and give migraine cocktail.  I-stat beta is negative. Trop negative. CBC shows no leukocytosis. BMP is unremarkable.  Discussed with patient. She reports headache improved significantly after migraine cocktail.   CT head shows no acute intracranial abnormality.  CTA reveals no evidence of aneurysm.  Delta troponin is negative.  At this time, do not feel this is representative of ACS etiology.  Patient is resting comfortably in bed with no signs of distress.  She reports that headache has completely resolved.  Patient reports feeling better.  I suspect her symptoms are likely due to migraine.  I will give her an ambulatory referral to neurology for further  evaluation. At this time,  patient exhibits no emergent life-threatening condition that require further evaluation in ED or admission. Patient had ample opportunity for questions and discussion. All patient's questions were answered with full understanding. Strict return precautions discussed. Patient expresses understanding and agreement to plan.   Portions of this note were generated with Scientist, clinical (histocompatibility and immunogenetics). Dictation errors may occur despite best attempts at proofreading.   Final Clinical Impression(s) / ED Diagnoses Final diagnoses:  Acute nonintractable headache, unspecified headache type  Atypical chest pain    Rx / DC Orders ED Discharge Orders         Ordered    Ambulatory referral to Neurology    Comments: An appointment is requested in approximately: 1 week   09/18/19 2210           Maxwell Caul, PA-C 09/18/19 2317    Gerhard Munch, MD 09/18/19 [phone]

## 2019-09-18 NOTE — Discharge Instructions (Addendum)
As we discussed, your work-up here today was reassuring.  I provided you with an outpatient referral to neurology for further evaluation of headaches.  Return emergency department for any worsening headache, nausea/vomiting, difficulty moving arms or legs, chest pain, difficulty breathing or any other worsening or concerning symptoms.

## 2019-09-20 LAB — CYTOLOGY - PAP
Comment: NEGATIVE
Comment: NEGATIVE
Comment: NEGATIVE
Diagnosis: NEGATIVE
HPV 16: NEGATIVE
HPV 18 / 45: NEGATIVE
High risk HPV: POSITIVE — AB

## 2019-09-21 ENCOUNTER — Other Ambulatory Visit: Payer: Self-pay

## 2019-09-21 MED ORDER — METRONIDAZOLE 500 MG PO TABS: ORAL_TABLET | ORAL | 0 refills | Status: DC

## 2019-09-21 NOTE — Progress Notes (Unsigned)
Flagyl rx sent per protocol for trichomonas treatment. Pt made aware.

## 2019-09-25 ENCOUNTER — Emergency Department (HOSPITAL_COMMUNITY)
Admission: EM | Admit: 2019-09-25 | Discharge: 2019-09-25 | Disposition: A | Discharge status: Home / Self Care | Payer: Self-pay | Attending: Emergency Medicine | Admitting: Emergency Medicine

## 2019-09-25 ENCOUNTER — Encounter (HOSPITAL_COMMUNITY): Payer: Self-pay | Admitting: Emergency Medicine

## 2019-09-25 ENCOUNTER — Other Ambulatory Visit: Payer: Self-pay

## 2019-09-25 DIAGNOSIS — R519 Headache, unspecified: Secondary | ICD-10-CM | POA: Insufficient documentation

## 2019-09-25 DIAGNOSIS — H53149 Visual discomfort, unspecified: Secondary | ICD-10-CM | POA: Insufficient documentation

## 2019-09-25 DIAGNOSIS — R110 Nausea: Secondary | ICD-10-CM | POA: Insufficient documentation

## 2019-09-25 MED ORDER — DEXAMETHASONE SODIUM PHOSPHATE 10 MG/ML IJ SOLN
10.0000 mg | Freq: Once | INTRAMUSCULAR | Status: AC
  Administered 2019-09-25: 04:00:00 10 mg via INTRAVENOUS
  Filled 2019-09-25: qty 1

## 2019-09-25 MED ORDER — DIPHENHYDRAMINE HCL 50 MG/ML IJ SOLN
25.0000 mg | Freq: Once | INTRAMUSCULAR | Status: AC
  Administered 2019-09-25: 25 mg via INTRAVENOUS
  Filled 2019-09-25: qty 1

## 2019-09-25 MED ORDER — PROCHLORPERAZINE EDISYLATE 10 MG/2ML IJ SOLN
10.0000 mg | Freq: Once | INTRAMUSCULAR | Status: AC
  Administered 2019-09-25: 10 mg via INTRAVENOUS
  Filled 2019-09-25: qty 2

## 2019-09-25 MED ORDER — KETOROLAC TROMETHAMINE 15 MG/ML IJ SOLN
15.0000 mg | Freq: Once | INTRAMUSCULAR | Status: AC
  Administered 2019-09-25: 15 mg via INTRAVENOUS
  Filled 2019-09-25: qty 1

## 2019-09-25 NOTE — ED Provider Notes (Signed)
[facility] EMERGENCY DEPARTMENT Provider Note  CSN: [id number] Arrival date & time: 09/25/19 0118  Chief Complaint(s) Headache  HPI Suzanne Simpson is a 44 y.o. female who presents to the emergency department with left-sided headache.  Patient reports that she has been having headache for the past several weeks.  She was seen last week for same count of pain and had a reassuring work-up including CT head.  Reports she was treated with migraine cocktail which did provide relief.  She has been taking over-the-counter medications including Excedrin and Tylenol however they have not provided any relief tonight.  She denies any recent fevers or infections.  No recent trauma.  She endorses photophobia.  Associated nausea without emesis.  No focal deficits.  No other physical complaints.  HPI  Past Medical History Past Medical History:  Diagnosis Date  . No pertinent past medical history   . Preeclampsia, unspecified trimester   . Pregnancy induced hypertension    Patient Active Problem List   Diagnosis Date Noted  . Unspecified essential hypertension 02/15/2013   Home Medication(s) Prior to Admission medications   Medication Sig Start Date End Date Taking? Authorizing Provider  Aspirin-Acetaminophen-Caffeine (GOODY HEADACHE PO) Take 1 tablet by mouth daily as needed. For headache    [provider]  ibuprofen (ADVIL,MOTRIN) 400 MG tablet Take 1 tablet (400 mg total) by mouth every 6 (six) hours as needed. 02/14/14   Sciacca, Marissa, PA-C  metroNIDAZOLE (FLAGYL) 500 MG tablet Take two tablets by mouth twice a day, for one day.  Or you can take all four tablets at once if you can tolerate it. 09/21/19   Constant, Peggy, MD  NIFEdipine (PROCARDIA-XL/NIFEDICAL-XL) 30 MG 24 hr tablet Take 1 tablet (30 mg total) by mouth daily. Can increase to twice a day as needed for symptomatic contractions 09/09/19   Constant, Peggy, MD                                                                                  Past Surgical History Past Surgical History:  Procedure Laterality Date  . ACHILLES TENDON REPAIR    . CESAREAN SECTION  2003  . CESAREAN SECTION  07/09/2012   Procedure: CESAREAN SECTION;  Surgeon: Loney Laurence, MD;  Location: [facility];  Service: Obstetrics;  Laterality: N/A;   Family History Family History  Problem Relation Age of Onset  . Diabetes Mother   . Hypertension Mother   . Hyperlipidemia Father   . Hypertension Maternal Aunt   . Diabetes Maternal Aunt     Social History Social History   Tobacco Use  . Smoking status: Never Smoker  . Smokeless tobacco: Never Used  Substance Use Topics  . Alcohol use: Yes  . Drug use: No   Allergies Patient has no known allergies.  Review of Systems Review of Systems All other systems are reviewed and are negative for acute change except as noted in the HPI  Physical Exam Vital Signs  I have reviewed the triage vital signs BP (!) 177/103   Pulse (!) 59   Temp 98.2 F (36.8 C) (Oral)   Resp 17   SpO2 97%   Physical Exam  Vitals reviewed.  Constitutional:      General: She is not in acute distress.    Appearance: She is well-developed. She is not diaphoretic.  HENT:     Head: Normocephalic and atraumatic.     Right Ear: External ear normal.     Left Ear: External ear normal.     Nose: Nose normal.  Eyes:     General: No scleral icterus.    Conjunctiva/sclera: Conjunctivae normal.  Neck:     Trachea: Phonation normal.   Cardiovascular:     Rate and Rhythm: Normal rate and regular rhythm.  Pulmonary:     Effort: Pulmonary effort is normal. No respiratory distress.     Breath sounds: No stridor.  Abdominal:     General: There is no distension.  Musculoskeletal:        General: Normal range of motion.     Cervical back: Normal range of motion. Muscular tenderness present. No spinous process tenderness.  Neurological:      Mental Status: She is alert and oriented to person, place, and time.     Cranial Nerves: Cranial nerves are intact.     Sensory: Sensation is intact.     Motor: Motor function is intact.  Psychiatric:        Behavior: Behavior normal.     ED Results and Treatments Labs (all labs ordered are listed, but only abnormal results are displayed) Labs Reviewed - No data to display                                                                                                                       EKG  EKG Interpretation  Date/Time:    Ventricular Rate:    PR Interval:    QRS Duration:   QT Interval:    QTC Calculation:   R Axis:     Text Interpretation:        Radiology No results found.  Pertinent labs & imaging results that were available during my care of the patient were reviewed by me and considered in my medical decision making (see chart for details).  Medications Ordered in ED Medications  diphenhydrAMINE (BENADRYL) injection 25 mg (25 mg Intravenous Given 09/25/19 0413)  dexamethasone (DECADRON) injection 10 mg (10 mg Intravenous Given 09/25/19 0412)  prochlorperazine (COMPAZINE) injection 10 mg (10 mg Intravenous Given 09/25/19 0413)  ketorolac (TORADOL) 15 MG/ML injection 15 mg (15 mg Intravenous Given 09/25/19 0412)  Procedures Procedures  (including critical care time)  Medical Decision Making / ED Course I have reviewed the nursing notes for this encounter and the patient's prior records (if available in EHR or on provided paperwork).   Suzanne Simpson was evaluated in Emergency Department on 09/25/2019 for the symptoms described in the history of present illness. She was evaluated in the context of the global COVID-19 pandemic, which necessitated consideration that the patient might be at risk for infection with the SARS-CoV-2 virus that  causes COVID-19. Institutional protocols and algorithms that pertain to the evaluation of patients at risk for COVID-19 are in a state of rapid change based on information released by regulatory bodies including the CDC and federal and state organizations. These policies and algorithms were followed during the patient's care in the ED.  Non focal neuro exam. No recent head trauma. No fever. Doubt meningitis. Doubt intracranial bleed. Doubt IIH. No indication for imaging.   Will treat with migraine cocktail and reevaluate.  5:42 AM Headache completely resolved  The patient is safe for discharge with strict return precautions.       Final Clinical Impression(s) / ED Diagnoses Final diagnoses:  Bad headache     The patient appears reasonably screened and/or stabilized for discharge and I doubt any other medical condition or other [facility] requiring further screening, evaluation, or treatment in the ED at this time prior to discharge.  Disposition: Discharge  Condition: Good  I have discussed the results, Dx and Tx plan with the patient who expressed understanding and agree(s) with the plan. Discharge instructions discussed at great length. The patient was given strict return precautions who verbalized understanding of the instructions. No further questions at time of discharge.    ED Discharge Orders    None        Follow Up: Primary care provider  Schedule an appointment as soon as possible for a visit  As needed     This chart was dictated using voice recognition software.  Despite best efforts to proofread,  errors can occur which can change the documentation meaning.   Nira Conn, MD 09/25/19 [phone]

## 2019-09-25 NOTE — ED Triage Notes (Signed)
Pt c/o 10/10 HA since last night at 11 pm, poor historian, no neuro deficit noticed on triage, pt was seen las week for same.

## 2019-10-20 ENCOUNTER — Ambulatory Visit: Payer: Medicaid Other

## 2019-10-27 ENCOUNTER — Other Ambulatory Visit: Payer: Self-pay

## 2019-10-27 ENCOUNTER — Encounter: Payer: Self-pay | Admitting: Neurology

## 2019-10-27 ENCOUNTER — Ambulatory Visit (INDEPENDENT_AMBULATORY_CARE_PROVIDER_SITE_OTHER): Payer: No Typology Code available for payment source | Admitting: Neurology

## 2019-10-27 VITALS — BP 136/84 | HR 73 | Temp 96.9°F | Ht 67.0 in | Wt 240.0 lb

## 2019-10-27 DIAGNOSIS — E669 Obesity, unspecified: Secondary | ICD-10-CM

## 2019-10-27 DIAGNOSIS — R351 Nocturia: Secondary | ICD-10-CM

## 2019-10-27 DIAGNOSIS — G43019 Migraine without aura, intractable, without status migrainosus: Secondary | ICD-10-CM

## 2019-10-27 DIAGNOSIS — R519 Headache, unspecified: Secondary | ICD-10-CM

## 2019-10-27 DIAGNOSIS — Z9189 Other specified personal risk factors, not elsewhere classified: Secondary | ICD-10-CM

## 2019-10-27 MED ORDER — TOPIRAMATE 50 MG PO TABS: ORAL_TABLET | ORAL | 5 refills | Status: AC

## 2019-10-27 MED ORDER — RIZATRIPTAN BENZOATE 5 MG PO TBDP: 5.0000 mg | ORAL_TABLET | ORAL | 0 refills | Status: AC | PRN

## 2019-10-27 NOTE — Patient Instructions (Addendum)
I do believe you have recurrent migraine headaches. You may have underlying obstructive sleep apnea as well. As discussed, I would like to proceed with a sleep study to rule out underlying sleep apnea. If you have sleep apnea, you will benefit most likely from using a CPAP or AutoPap machine.  We will start you for migraine prevention on Topamax 50 mg: Take half a pill each bedtime for one week, then one pill at bedtime daily for one week, then 1-1/2 pills at bedtime daily for one week, then 2 pills at bedtime daily thereafter. Common side effects reported are: Sedation, sleepiness, tingling, change in taste especially with carbonated drinks, and rare side effects are glaucoma, kidney stones, and problems with thinking including word finding difficulties.  For as needed use for a migraine attack, you can use Maxalt orally disintegrating tab, 5 mg: take 1 pill early on when you suspect a migraine attack come on. You may take another pill within 2 hours, no more than 2 pills in 24 hours. Most people who take triptans do not have any serious side-effects. However, they can cause drowsiness (remember to not drive or use heavy machinery when drowsy), nausea, dizziness, dry mouth. Less common side effects include strange sensations, such as tightness in your chest or throat, tingling, flushing, and feelings of heaviness or pressure in areas such as the face, limbs, and chest. These in the chest can mimic heart related pain (angina) and may cause alarm, but usually these sensations are not harmful or a sign of a heart attack. However, if you develop intense chest pain or sensations of discomfort, you should stop taking your medication and consult with me or your PCP or go to the nearest urgent care facility or ER or call 911.

## 2019-10-27 NOTE — Progress Notes (Signed)
Subjective:    Patient ID: Suzanne Simpson is a 44 y.o. female.  HPI     Suzanne Age, MD, [name] [facility] [address], [address] [place], [place] [address]  I saw patient, Suzanne Simpson, as a referral from the emergency room for recurrent headaches.  The patient is unaccompanied today.  Suzanne Simpson is a 44 year old right-handed woman with an underlying medical history of hypertension and obesity, who reports recurrent migrainous headaches for the past month, nearly daily.  She has a prior history of infrequent migraines since teenage years when she started having a menstrual period.  She was recently told that she is going through menopause.  She has had elevated blood pressure values and was recently started on nifedipine.  She often wakes up with a headache first thing in the morning or in the middle of the night.  She does have associated nausea or vomiting sometimes, photophobia, but no visual aura.  She denies any associated one-sided weakness or numbness or tingling or slurring of speech or droopy face.  She presented to the emergency room on 09/18/2019 with a headache.  Neurological exam is nonfocal, she was treated symptomatically.  She had head imaging including a head CT without contrast and CT angiogram head with and without contrast on 09/18/2019 and I reviewed the results: IMPRESSION: No acute intracranial abnormality.  No aneurysm. She received Reglan, Benadryl and Zofran.  She again presented to the emergency room on 09/25/2019 with a left-sided headache.  She was treated symptomatically with Benadryl, Decadron, Toradol and Compazine. Her paternal half sister has migraines.  She has done well with Topamax apparently.  Patient has not been on any prescription preventative medications or abortive medications and has been taking Tylenol as needed.  The headache is often throbbing and one-sided.  She has been taking the nifedipine as prescribed.  Of note, she has  significant nocturia about 2-3 times per average night.  She lives with her children, she is single, she has recently reduced her caffeine intake.  She was drinking about a liter of Pepsi per day, until about 2 weeks ago.  She is trying to drink more water, she is trying to lose weight.  She has recently received updated prescription eyeglasses through [employer].  Her Past Medical History Is Significant For: Past Medical History:  Diagnosis Date  . No pertinent past medical history   . Preeclampsia, unspecified trimester   . Pregnancy induced hypertension     Her Past Surgical History Is Significant For: Past Surgical History:  Procedure Laterality Date  . ACHILLES TENDON REPAIR    . CESAREAN SECTION  2003  . CESAREAN SECTION  07/09/2012   Procedure: CESAREAN SECTION;  Surgeon: Daria Pastures, MD;  Location: [facility];  Service: Obstetrics;  Laterality: N/A;    Her Family History Is Significant For: Family History  Problem Relation Simpson of Onset  . Diabetes Mother   . Hypertension Mother   . Hyperlipidemia Father   . Hypertension Maternal Aunt   . Diabetes Maternal Aunt     Her Social History Is Significant For: Social History   Socioeconomic History  . Marital status: Single    Spouse name: Not on file  . Number of children: Not on file  . Years of education: Not on file  . Highest education level: Not on file  Occupational History  . Not on file  Tobacco Use  . Smoking status: Never Smoker  . Smokeless tobacco: Never Used  Substance and Sexual Activity  . Alcohol use: Yes  . Drug use: No  . Sexual activity: Yes    Birth control/protection: None  Other Topics Concern  . Not on file  Social History Narrative  . Not on file   Social Determinants of Health   Financial Resource Strain:   . Difficulty of Paying Living Expenses: Not on file  Food Insecurity:   . Worried About Programme researcher, broadcasting/film/video in the Last Year: Not on file  . Ran Out of Food in the Last  Year: Not on file  Transportation Needs:   . Lack of Transportation (Medical): Not on file  . Lack of Transportation (Non-Medical): Not on file  Physical Activity:   . Days of Exercise per Week: Not on file  . Minutes of Exercise per Session: Not on file  Stress:   . Feeling of Stress : Not on file  Social Connections:   . Frequency of Communication with Friends and Family: Not on file  . Frequency of Social Gatherings with Friends and Family: Not on file  . Attends Religious Services: Not on file  . Active Member of Clubs or Organizations: Not on file  . Attends Banker Meetings: Not on file  . Marital Status: Not on file    Her Allergies Are:  No Known Allergies:   Her Current Medications Are:  Outpatient Encounter Medications as of 10/27/2019  Medication Sig  . acetaminophen (ACETAMINOPHEN 8 HOUR) 650 MG CR tablet Take 650 mg by mouth every 8 (eight) hours as needed for pain.  Marland Kitchen NIFEdipine (PROCARDIA-XL/NIFEDICAL-XL) 30 MG 24 hr tablet Take 1 tablet (30 mg total) by mouth daily. Can increase to twice a day as needed for symptomatic contractions  . rizatriptan (MAXALT-MLT) 5 MG disintegrating tablet Take 1 tablet (5 mg total) by mouth as needed for migraine. May repeat in 2 h if needed, no more than 2 pills/24 h, no more than 3 pills/week.  . topiramate (TOPAMAX) 50 MG tablet 1/2 pill each bedtime x 1 week, then 1 pill nightly x 1 week, then 1-1/2 pills nightly x 1 week, then 2 pills nightly thereafter.  . [DISCONTINUED] Aspirin-Acetaminophen-Caffeine (GOODY HEADACHE PO) Take 1 tablet by mouth daily as needed. For headache  . [DISCONTINUED] ibuprofen (ADVIL,MOTRIN) 400 MG tablet Take 1 tablet (400 mg total) by mouth every 6 (six) hours as needed.  . [DISCONTINUED] metroNIDAZOLE (FLAGYL) 500 MG tablet Take two tablets by mouth twice a day, for one day.  Or you can take all four tablets at once if you can tolerate it.   No facility-administered encounter medications on  file as of 10/27/2019.  :   Review of Systems:  Out of a complete 14 point review of systems, all are reviewed and negative with the exception of these symptoms as listed below:  Review of Systems  Neurological:       Reports she is here to discuss her daily h/a. Reports she typically will treat with OTC Tylenol and essential oils.  Epworth Sleepiness Scale 0= would never doze 1= slight chance of dozing 2= moderate chance of dozing 3= high chance of dozing  Sitting and reading:0 Watching TV:1 Sitting inactive in a public place (ex. Theater or meeting):1 As a passenger in a car for an hour without a break:2 Lying down to rest in the afternoon:3 Sitting and talking to someone:0 Sitting quietly after lunch (no alcohol):2 In a car, while stopped in traffic:0 Total:9     Objective:  Neurological Exam  Physical Exam Physical Examination:   Vitals:   10/27/19 0948  BP: 136/84  Pulse: 73  Temp: (!) 96.9 F (36.1 C)  SpO2: 97%    General Examination: The patient is a very pleasant 44 y.o. female in no acute distress. She appears well-developed and well-nourished and well groomed. She initially is wearing dark glasses above her prescription eyeglasses but is able to remove the dark glasses for the examination.  HEENT: Normocephalic, atraumatic, pupils are equal, round and reactive to light and accommodation. Funduscopic exam is normal with sharp disc margins noted. Extraocular tracking is good without limitation to gaze excursion or nystagmus noted. Normal smooth pursuit is noted. Hearing is grossly intact. Face is symmetric with normal facial animation and normal facial sensation. Speech is clear with no dysarthria noted. There is no hypophonia. There is no lip, neck/head, jaw or voice tremor. Neck is supple with full range of passive and active motion. There are no carotid bruits on auscultation. Oropharynx exam reveals: mild mouth dryness, adequate dental hygiene and moderate  airway crowding, due to Smaller airway entry and tonsillar size of about 1+, slightly elongated tongue, Mallampati class III, neck circumference of 16 inches.  Tongue protrudes centrally in palate elevates symmetrically.   Chest: Clear to auscultation without wheezing, rhonchi or crackles noted.  Heart: S1+S2+0, regular and normal without murmurs, rubs or gallops noted.   Abdomen: Soft, non-tender and non-distended with normal bowel sounds appreciated on auscultation.  Extremities: There is no pitting edema in the distal lower extremities bilaterally. Pedal pulses are intact.  Skin: Warm and dry without trophic changes noted. There are no varicose veins.  Musculoskeletal: exam reveals no obvious joint deformities, tenderness or joint swelling or erythema.   Neurologically:  Mental status: The patient is awake, alert and oriented in all 4 spheres. Her immediate and remote memory, attention, language skills and fund of knowledge are appropriate. There is no evidence of aphasia, agnosia, apraxia or anomia. Speech is clear with normal prosody and enunciation. Thought process is linear. Mood is normal and affect is normal.  Cranial nerves II - XII are as described above under HEENT exam. In addition: shoulder shrug is normal with equal shoulder height noted. Motor exam: Normal bulk, strength and tone is noted. There is no drift, tremor or rebound. Romberg is negative. Reflexes are 2+ throughout. Babinski: Toes are flexor bilaterally. Fine motor skills and coordination: intact with normal finger taps, normal hand movements, normal rapid alternating patting, normal foot taps and normal foot agility.  Cerebellar testing: No dysmetria or intention tremor on finger to nose testing. Heel to shin is unremarkable bilaterally. There is no truncal or gait ataxia.  Sensory exam: intact to light touch, vibration, temperature sense in the upper and lower extremities.  Gait, station and balance: She stands easily.  No veering to one side is noted. No leaning to one side is noted. Posture is Simpson-appropriate and stance is narrow based. Gait shows normal stride length and normal pace. No problems turning are noted. Tandem walk is unremarkable.                Assessment and Plan:  In summary, Suzanne Simpson is a very pleasant 44 y.o.-year old female with an underlying medical history of hypertension and obesity, who Presents as a referral from the emergency room for recurrent headaches.  She has a history of migraine headaches which have been infrequent in the past.  In the past month, she has had nearly daily  headaches and has often woken up in the middle of the night or first thing in the morning with headaches.  While she may have an escalation of her migraine headaches.  Underlying sleep disordered breathing and newly diagnosed hypertension may also play a role In exacerbation of her headaches.  She is advised that we should proceed with a sleep study to rule out obstructive sleep apnea.  She is encouraged to continue to pursue healthy lifestyle, weight loss, good hydration with water and continue with her blood pressure medication and follow-up with her primary care practitioner.She is advised to start Topamax for migraine prevention, we will start at bedtime with gradual titration.  I talked to her about expectations and potential side effects.  She was also advised to start Maxalt 5 mg melt tab for as needed use of migraines, for acute management.  She was given written instructions and a new prescription for the Topamax and Maxalt as well as description of potential side effects.  If she has obstructive sleep apnea, She is advised to consider CPAP or AutoPap therapy.  She has a nonfocal neurological exam and is reassured.  She is advised to follow-up routinely to see the Nurse practitioner in our clinic in about 3 months, sooner if needed.  We will keep her posted as to her sleep study results in the interim.  I answered  all her questions today and she was in agreement with the plan.  Huston Foley, MD, [name]

## 2019-10-30 ENCOUNTER — Other Ambulatory Visit: Payer: Self-pay | Admitting: Obstetrics and Gynecology

## 2019-11-10 ENCOUNTER — Other Ambulatory Visit: Payer: Self-pay

## 2019-11-10 ENCOUNTER — Ambulatory Visit
Admission: RE | Admit: 2019-11-10 | Discharge: 2019-11-10 | Disposition: A | Discharge status: Home / Self Care | Payer: No Typology Code available for payment source | Source: Ambulatory Visit | Attending: Obstetrics and Gynecology | Admitting: Obstetrics and Gynecology

## 2019-11-10 DIAGNOSIS — Z01419 Encounter for gynecological examination (general) (routine) without abnormal findings: Secondary | ICD-10-CM

## 2019-11-15 ENCOUNTER — Other Ambulatory Visit: Payer: Self-pay | Admitting: Obstetrics and Gynecology

## 2019-11-15 DIAGNOSIS — R928 Other abnormal and inconclusive findings on diagnostic imaging of breast: Secondary | ICD-10-CM

## 2019-11-22 ENCOUNTER — Telehealth: Payer: Self-pay

## 2019-11-22 NOTE — Telephone Encounter (Signed)
We have attempted to call the patient two times to schedule sleep study.  Patient has been unavailable at the phone numbers we have on file and has not returned our calls. If patient calls back we will schedule them for their sleep study.  

## 2019-12-03 ENCOUNTER — Other Ambulatory Visit: Payer: No Typology Code available for payment source

## 2019-12-17 ENCOUNTER — Other Ambulatory Visit: Payer: Self-pay | Admitting: Obstetrics and Gynecology

## 2019-12-17 ENCOUNTER — Ambulatory Visit
Admission: RE | Admit: 2019-12-17 | Discharge: 2019-12-17 | Disposition: A | Discharge status: Home / Self Care | Payer: No Typology Code available for payment source | Source: Ambulatory Visit | Attending: Obstetrics and Gynecology | Admitting: Obstetrics and Gynecology

## 2019-12-17 ENCOUNTER — Other Ambulatory Visit: Payer: Self-pay

## 2019-12-17 DIAGNOSIS — R928 Other abnormal and inconclusive findings on diagnostic imaging of breast: Secondary | ICD-10-CM

## 2019-12-17 DIAGNOSIS — N6310 Unspecified lump in the right breast, unspecified quadrant: Secondary | ICD-10-CM

## 2020-01-10 ENCOUNTER — Ambulatory Visit (INDEPENDENT_AMBULATORY_CARE_PROVIDER_SITE_OTHER): Payer: Medicaid Other

## 2020-01-10 ENCOUNTER — Other Ambulatory Visit (HOSPITAL_COMMUNITY)
Admission: RE | Admit: 2020-01-10 | Discharge: 2020-01-10 | Disposition: A | Discharge status: Home / Self Care | Payer: Medicaid Other | Source: Ambulatory Visit | Attending: Obstetrics and Gynecology | Admitting: Obstetrics and Gynecology

## 2020-01-10 ENCOUNTER — Other Ambulatory Visit: Payer: Self-pay

## 2020-01-10 VITALS — BP 137/86 | HR 55 | Ht 67.0 in | Wt 243.0 lb

## 2020-01-10 DIAGNOSIS — Z113 Encounter for screening for infections with a predominantly sexual mode of transmission: Secondary | ICD-10-CM

## 2020-01-10 DIAGNOSIS — N898 Other specified noninflammatory disorders of vagina: Secondary | ICD-10-CM | POA: Insufficient documentation

## 2020-01-10 DIAGNOSIS — Z202 Contact with and (suspected) exposure to infections with a predominantly sexual mode of transmission: Secondary | ICD-10-CM | POA: Diagnosis not present

## 2020-01-10 NOTE — Patient Instructions (Signed)
AREA FAMILY PRACTICE PHYSICIANS  [place] ([address]) . [facility] o [address] [place], [place] [address] o [phone] o Mon-Fri 8:30-12:30, 1:30-5:00 o Accepting Medicaid . [facility] o [address], [place], [place] [address] o [phone] o Mon-Fri 8:00-5:30 . [facility] o [address] [place], [place] [address] o [phone] o Mon, Tue, Thur, Fri 8:30-5:00, Wed 10:00-7:00 (closed 1-2pm) o Accepting Medicaid . [facility] o [address], [place], [place]  [address] o Phone - [phone]   Fax - [phone]  [place] ([address]) . [facility] o [address] [place], [place] [address] o [phone] o Mon-Fri 8:00-5:00 . [facility] ([facility]  o [address] [place], [place] [address] o [phone] o Mon-Fri 8:30-5:30, Sat (Oct.-Mar.) 9:00-1:00 o Accepting Medicaid  [place] ([address]) . [facility] o [address], [place], [place] [address] o [phone] o Mon-Fri 8:00-5:00  [place] ([address]) . [facility] o [address], [place], [place] [address] o [phone] o Mon-Fri 8:00-5:00 . [facility] o [address], [place], [place] [address] o [phone] o Mon-Fri 8:00-5:00 . [facility] o [address] [place], [place] [address] o [phone] o Mon-Fri 8:00-5:00 . [facility] o [address] [place] [address] o [phone] o Mon-Fri 7:30-5:30  [place] ([address] & [address]) . [facility] o [address] [place], [place] [address] o [phone] o Mon-Thur 8:00-6:00 o Accepting Medicaid . [facility] o [address] [place], [place] [address] o [phone] o Mon-Thur 7:30-7:30, Fri 7:30-4:30 o Accepting Medicaid . [facility] o [address], [place], [place]  [address] o [phone]   Fax - [phone]  [place] ([address] & [address]) . [facility] o [address] , [place] [address] o [phone] o Mon-Fri 7:00-5:00 . [facility] o [address], [place], [place] [address] o [phone] o Mon-Fri 8:00-5:00 o Accepting Medicaid . [facility] o [address], , [place] [address] o [phone] o Mon-Fri 8:00-5:00 o Accepting Medicaid  [place] ([address]) . [facility] [place] o [address] [place], [place] [address] o [phone] o Mon-Fri 8:00-5:00 . [facility] o [address], [place], [place] [address] o [phone] o Mon-Fri 8:00-5:00 o Accepting Medicaid . [facility] o [address], [place], [place] [address] o [phone] o Mon-Fri 8:00-5:30, Sat&Sun by appointment (phones open at 8:30) o Accepting Medicaid  [place] ([address]) . [facility] o [address] [place], [place] [address] o [phone] o Mon-Thur 8:00-7:00, Fri 8:00-5:00, Sat 8:00-12:00, Sun 9:00-12:00 o Accepting Medicaid . [facility] o [address], [place], [place] [address] o [phone] o Mon-Thur 8:00-5:00 o Accepting Medicaid . [facility] o [address] [place], [place] [address] o [phone] o Mon-Fri 8:00-5:30, Sat (Oct.-Mar.) 9:00-1:00 o Accepting Medicaid  [place]  ([address]) .   [facility] o [address], [place], [place] [address] o [phone] o Mon-Fri 8:00-5:00 o Accepting Medicaid   [place] ([address]) . [facility] [place] o [address], [place], [place] [address] o [phone] o Mon-Fri 8:00-5:00 . [facility] [place] o [address], [place], [place] [address] o [phone] o Mon-Fri 8:00-5:00 . [facility] [place] o [address] [place] [address], [place], [place] [address] o [phone] o Mon-Fri 8:00-5:00 o After hours clinic ([address] [place], [place] [address]) [phone] Mon-Fri 5:00-8:00, Sat 12:00-6:00, Sun 10:00-4:00 o Accepting Medicaid . [facility] [place] o [address], [place], [place]  [address] o [phone]   Fax - [phone]  [place] ([address]) . [facility] [place] o [address], [place], [place] [address] o [phone] o Mon-Fri 8:00-5:00 . [facility] [place] ([facility] [place] o [address], [place], [place] [address] o [phone] o Mon-Thur 8:00-7:00, Fri 8:00-5:00, Sat 8:00-12:00    

## 2020-01-10 NOTE — Progress Notes (Signed)
SUBJECTIVE:  44 y.o. female complains of malodorous vaginal discharge for 3 week(s). C/o abnormal vaginal bleeding. Denies significant pelvic pain or fever. No UTI symptoms. Denies history of known exposure to STD.  Patient's last menstrual period was 01/05/2020.  OBJECTIVE:  She appears well, afebrile. Urine dipstick: not done.  ASSESSMENT:  Vaginal Discharge  Vaginal Odor   PLAN:  GC, chlamydia, trichomonas, probe sent to lab. Treatment: To be determined once lab results are received ROV prn if symptoms persist or worsen.

## 2020-01-11 LAB — CERVICOVAGINAL ANCILLARY ONLY
Chlamydia: NEGATIVE
Comment: NEGATIVE
Comment: NEGATIVE
Comment: NORMAL
Neisseria Gonorrhea: NEGATIVE
Trichomonas: NEGATIVE

## 2020-01-14 NOTE — Progress Notes (Signed)
Patient seen and assessed by nursing staff during this encounter. I have reviewed the chart and agree with the documentation and plan. I have also made any necessary editorial changes.  Catalina Antigua, MD 01/14/2020 8:17 AM

## 2020-05-11 ENCOUNTER — Other Ambulatory Visit: Payer: Medicaid Other

## 2020-05-18 ENCOUNTER — Other Ambulatory Visit: Payer: Medicaid Other

## 2020-05-19 ENCOUNTER — Ambulatory Visit
Admission: EM | Admit: 2020-05-19 | Discharge: 2020-05-19 | Disposition: A | Discharge status: Home / Self Care | Payer: Medicaid Other | Attending: Internal Medicine | Admitting: Internal Medicine

## 2020-05-19 DIAGNOSIS — Z20822 Contact with and (suspected) exposure to covid-19: Secondary | ICD-10-CM

## 2020-05-19 NOTE — Discharge Instructions (Signed)

## 2020-05-23 LAB — NOVEL CORONAVIRUS, NAA: SARS-CoV-2, NAA: NOT DETECTED

## 2020-05-25 ENCOUNTER — Telehealth: Payer: Self-pay | Admitting: Unknown Physician Specialty

## 2020-05-25 NOTE — Telephone Encounter (Signed)
I connected by phone with Suzanne Simpson on 05/25/2020 at 12:20 PM to discuss the potential use of a new treatment for mild to moderate COVID-19 viral infection in non-hospitalized patients.  This patient is a 44 y.o. female that meets the FDA criteria for Emergency Use Authorization of COVID monoclonal antibody casirivimab/imdevimab.  Has a (+) direct SARS-CoV-2 viral test result  Has mild or moderate COVID-19   Is NOT hospitalized due to COVID-19  Is within 10 days of symptom onset  Has at least one of the high risk factor(s) for progression to severe COVID-19 and/or hospitalization as defined in EUA.  Specific high risk criteria : BMI > 25   I have spoken and communicated the following to the patient or parent/caregiver regarding COVID monoclonal antibody treatment:  1. FDA has authorized the emergency use for the treatment of mild to moderate COVID-19 in adults and pediatric patients with positive results of direct SARS-CoV-2 viral testing who are 74 years of age and older weighing at least 40 kg, and who are at high risk for progressing to severe COVID-19 and/or hospitalization.  2. The significant known and potential risks and benefits of COVID monoclonal antibody, and the extent to which such potential risks and benefits are unknown.  3. Information on available alternative treatments and the risks and benefits of those alternatives, including clinical trials.  4. Patients treated with COVID monoclonal antibody should continue to self-isolate and use infection control measures (e.g., wear mask, isolate, social distance, avoid sharing personal items, clean and disinfect high touch surfaces, and frequent handwashing) according to CDC guidelines.   5. The patient or parent/caregiver has the option to accept or refuse COVID monoclonal antibody treatment.  After reviewing this information with the patient, The patient has DECLINED offer to receive the infusion. Gabriel Cirri 05/25/2020 12:20 PM

## 2020-06-19 ENCOUNTER — Other Ambulatory Visit: Payer: No Typology Code available for payment source

## 2021-03-02 IMAGING — CT CT ANGIO HEAD
3 of 11 series · 9 of 47 positions shown · IV contrast (omnipaque)
Comparison: None.

CLINICAL DATA: Headache

EXAM:
CT ANGIOGRAPHY HEAD
TECHNIQUE: Multidetector CT imaging of the head was performed using the
standard protocol during bolus administration of intravenous
contrast. Multiplanar CT image reconstructions and MIPs were
obtained to evaluate the vascular anatomy.
CONTRAST:  75mL OMNIPAQUE IOHEXOL 350 MG/ML SOLN

[Series 5: head bone · axial · 0.46mm/px · z∈[-19,+77]mm · 4 of 80 slices shown]
[im 16/80  bone]
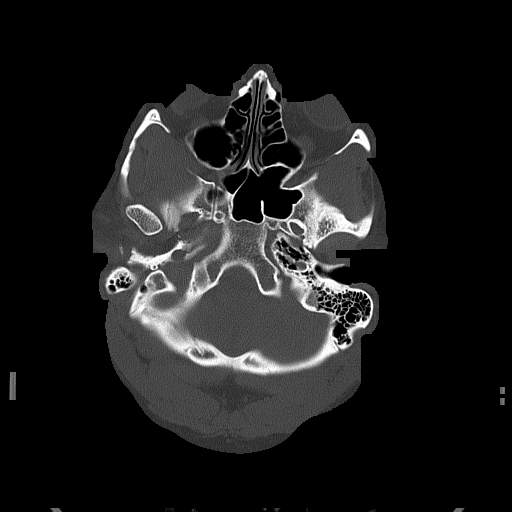
[im 32/80  bone]
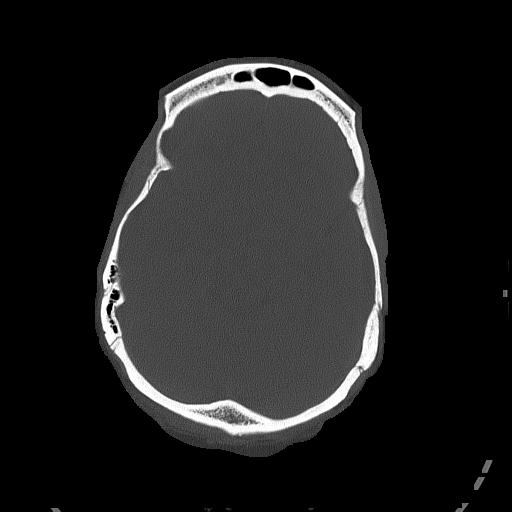
[im 48/80  bone]
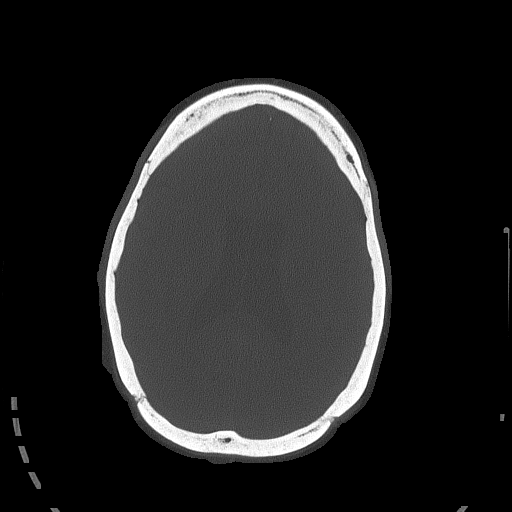
[im 64/80  bone]
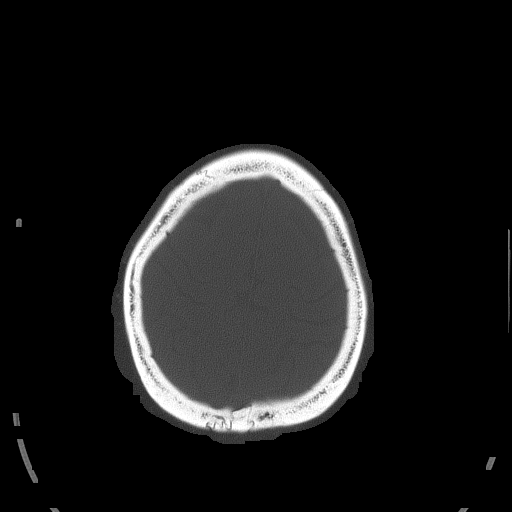

[Series 6: coronal · coronal · 0.31mm/px · 1 of 71 slices shown]
[im 36/71  brain]
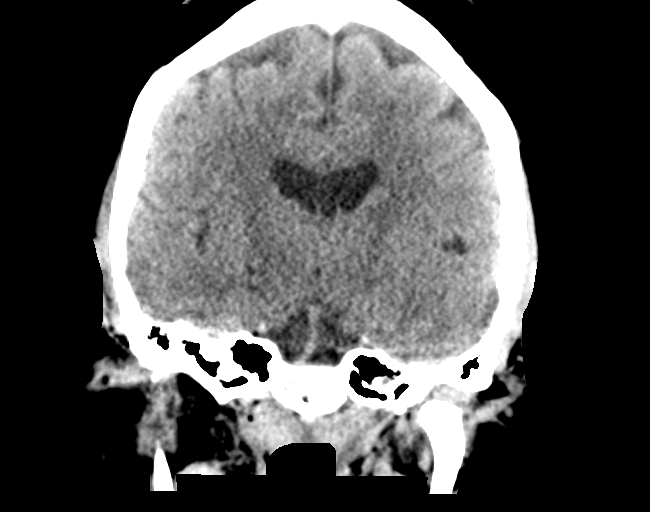

[Series 9: cow 2.0 · axial · 0.42mm/px · z∈[-34,+58]mm · 4 of 78 slices shown]
[im 16/78  brain]
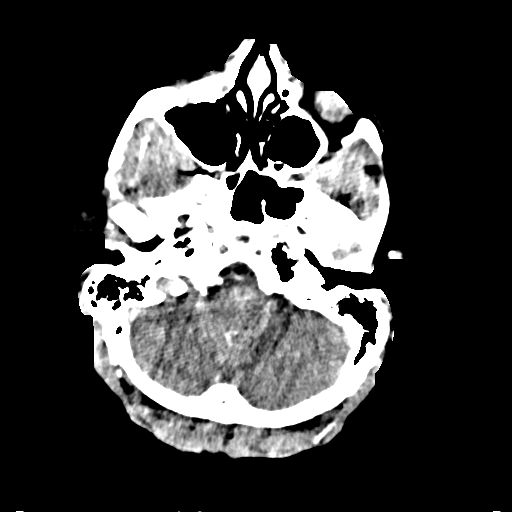
[im 31/78  bone]
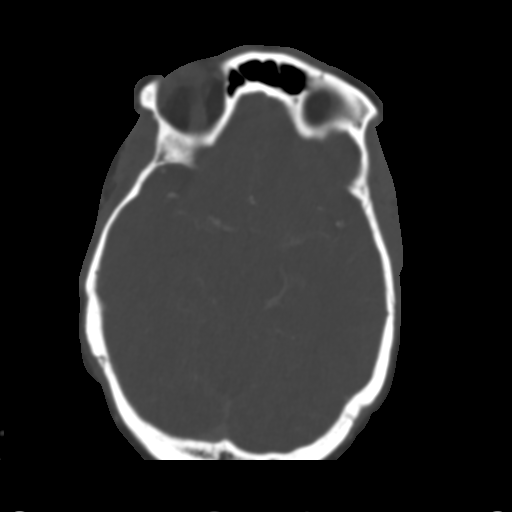
[im 47/78  brain]
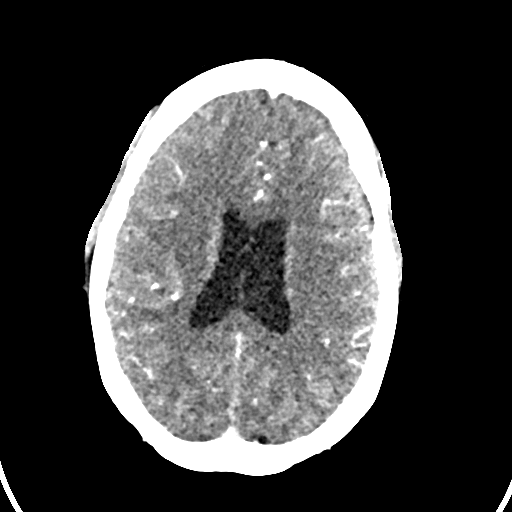
[im 62/78  bone]
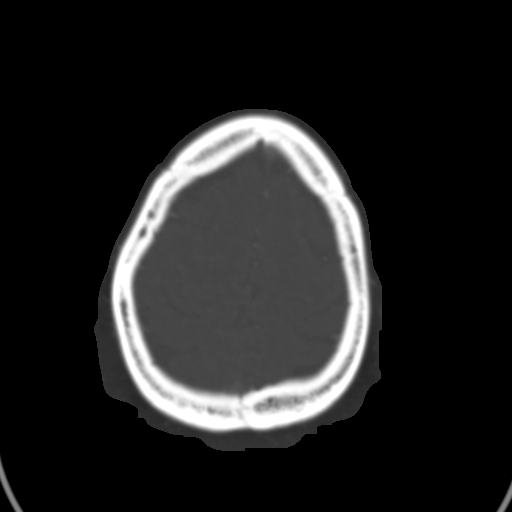

[9 of 47 positions shown; findings below may reference images not displayed]

FINDINGS: CT HEAD

Brain: There is no acute intracranial hemorrhage, mass-effect, or
edema. Gray-white differentiation is preserved. There is no
extra-axial fluid collection. Ventricles and sulci are within normal
limits in size and configuration.

Vascular: No hyperdense vessel or unexpected calcification.

Skull: Calvarium is unremarkable.

Sinuses/Orbits: No acute finding.

Other: None.

CTA HEAD

Anterior circulation: Intracranial internal carotid arteries are
patent. Anterior and middle cerebral arteries are patent.

Posterior circulation: Intracranial vertebral arteries, basilar
artery, and posterior cerebral arteries are patent.

Venous sinuses: As permitted by contrast timing, patent.
IMPRESSION: No acute intracranial abnormality.  No aneurysm.

## 2024-03-26 ENCOUNTER — Encounter: Payer: Self-pay | Admitting: Advanced Practice Midwife
